# Patient Record
Sex: Female | Born: 2005 | Race: White | Hispanic: No | Smoking: Never smoker
Health system: Southern US, Community
[De-identification: ages and names within clinical notes are randomized; demographics above are authoritative.]

## PROBLEM LIST (undated history)

## (undated) DIAGNOSIS — R011 Cardiac murmur, unspecified: Secondary | ICD-10-CM

---
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
## 2023-05-26 ENCOUNTER — Ambulatory Visit (INDEPENDENT_AMBULATORY_CARE_PROVIDER_SITE_OTHER): Payer: BC Managed Care – PPO

## 2023-05-26 VITALS — BP 100/64 | HR 70 | Ht 61.0 in | Wt 129.0 lb

## 2023-05-26 DIAGNOSIS — Z3201 Encounter for pregnancy test, result positive: Secondary | ICD-10-CM

## 2023-05-26 LAB — POCT URINE PREGNANCY: Preg Test, Ur: POSITIVE — AB

## 2023-05-26 NOTE — Progress Notes (Addendum)
Ms. Gilleran presents today for UPT. She has no unusual complaints.  LMP: 04/13/2023    OBJECTIVE: Appears well, in no apparent distress.  OB History     Gravida  1   Para      Term      Preterm      AB      Living         SAB      IAB      Ectopic      Multiple      Live Births             Home UPT Result:POSITIVE In-Office UPT result: POSITIVE  I have reviewed the patient's medical, obstetrical, social, and family histories, and medications.   ASSESSMENT: Positive pregnancy test LMP  04/13/2023 EDD  01/18/2024 GA     [redacted]w[redacted]d  PLAN Prenatal care to be completed at: [facility].  Make appointments for Nurse Intake and NOB visit.

## 2023-07-08 DIAGNOSIS — Z3A12 12 weeks gestation of pregnancy: Secondary | ICD-10-CM | POA: Diagnosis not present

## 2023-07-08 DIAGNOSIS — O3680X9 Pregnancy with inconclusive fetal viability, other fetus: Secondary | ICD-10-CM | POA: Diagnosis not present

## 2023-07-08 DIAGNOSIS — N912 Amenorrhea, unspecified: Secondary | ICD-10-CM | POA: Diagnosis not present

## 2023-07-09 DIAGNOSIS — N925 Other specified irregular menstruation: Secondary | ICD-10-CM | POA: Diagnosis not present

## 2023-07-09 DIAGNOSIS — R112 Nausea with vomiting, unspecified: Secondary | ICD-10-CM | POA: Diagnosis not present

## 2023-07-14 ENCOUNTER — Encounter: Payer: BC Managed Care – PPO | Admitting: Advanced Practice Midwife

## 2023-08-04 DIAGNOSIS — Z113 Encounter for screening for infections with a predominantly sexual mode of transmission: Secondary | ICD-10-CM | POA: Diagnosis not present

## 2023-08-04 DIAGNOSIS — Z362 Encounter for other antenatal screening follow-up: Secondary | ICD-10-CM | POA: Diagnosis not present

## 2023-08-06 LAB — OB RESULTS CONSOLE GC/CHLAMYDIA
Chlamydia: NEGATIVE
Neisseria Gonorrhea: NEGATIVE

## 2023-09-02 DIAGNOSIS — Z364 Encounter for antenatal screening for fetal growth retardation: Secondary | ICD-10-CM | POA: Diagnosis not present

## 2023-09-02 DIAGNOSIS — Z363 Encounter for antenatal screening for malformations: Secondary | ICD-10-CM | POA: Diagnosis not present

## 2023-09-02 DIAGNOSIS — Z3A20 20 weeks gestation of pregnancy: Secondary | ICD-10-CM | POA: Diagnosis not present

## 2024-01-08 ENCOUNTER — Other Ambulatory Visit: Payer: Self-pay | Admitting: Obstetrics & Gynecology

## 2024-01-16 ENCOUNTER — Encounter (HOSPITAL_COMMUNITY): Payer: Self-pay | Admitting: Obstetrics and Gynecology

## 2024-01-16 ENCOUNTER — Inpatient Hospital Stay (HOSPITAL_COMMUNITY)
Admission: AD | Admit: 2024-01-16 | Discharge: 2024-01-19 | DRG: 787 | Disposition: A | Discharge status: Home / Self Care | Payer: Medicaid Other | Attending: Obstetrics and Gynecology | Admitting: Obstetrics and Gynecology

## 2024-01-16 ENCOUNTER — Inpatient Hospital Stay (HOSPITAL_COMMUNITY): Payer: Medicaid Other | Admitting: Anesthesiology

## 2024-01-16 ENCOUNTER — Other Ambulatory Visit: Payer: Self-pay

## 2024-01-16 DIAGNOSIS — D62 Acute posthemorrhagic anemia: Secondary | ICD-10-CM | POA: Diagnosis not present

## 2024-01-16 DIAGNOSIS — O9081 Anemia of the puerperium: Secondary | ICD-10-CM | POA: Diagnosis not present

## 2024-01-16 DIAGNOSIS — Z8249 Family history of ischemic heart disease and other diseases of the circulatory system: Secondary | ICD-10-CM | POA: Diagnosis not present

## 2024-01-16 DIAGNOSIS — O26893 Other specified pregnancy related conditions, third trimester: Secondary | ICD-10-CM | POA: Diagnosis not present

## 2024-01-16 DIAGNOSIS — Z9049 Acquired absence of other specified parts of digestive tract: Secondary | ICD-10-CM

## 2024-01-16 DIAGNOSIS — Z3A39 39 weeks gestation of pregnancy: Secondary | ICD-10-CM

## 2024-01-16 DIAGNOSIS — Z98891 History of uterine scar from previous surgery: Secondary | ICD-10-CM

## 2024-01-16 DIAGNOSIS — O3663X0 Maternal care for excessive fetal growth, third trimester, not applicable or unspecified: Secondary | ICD-10-CM | POA: Diagnosis not present

## 2024-01-16 LAB — OB RESULTS CONSOLE HIV ANTIBODY (ROUTINE TESTING): HIV: NONREACTIVE

## 2024-01-16 LAB — CBC
HCT: 33.9 % — ABNORMAL LOW (ref 36.0–46.0)
Hemoglobin: 11.3 g/dL — ABNORMAL LOW (ref 12.0–15.0)
MCH: 27.4 pg (ref 26.0–34.0)
MCHC: 33.3 g/dL (ref 30.0–36.0)
MCV: 82.3 fL (ref 80.0–100.0)
Platelets: 206 10*3/uL (ref 150–400)
RBC: 4.12 MIL/uL (ref 3.87–5.11)
RDW: 13.4 % (ref 11.5–15.5)
WBC: 13.3 10*3/uL — ABNORMAL HIGH (ref 4.0–10.5)
nRBC: 0 % (ref 0.0–0.2)

## 2024-01-16 LAB — OB RESULTS CONSOLE GBS: GBS: NEGATIVE

## 2024-01-16 LAB — TYPE AND SCREEN
ABO/RH(D): O POS
Antibody Screen: NEGATIVE

## 2024-01-16 LAB — OB RESULTS CONSOLE RUBELLA ANTIBODY, IGM: Rubella: IMMUNE

## 2024-01-16 LAB — OB RESULTS CONSOLE HEPATITIS B SURFACE ANTIGEN: Hepatitis B Surface Ag: NEGATIVE

## 2024-01-16 LAB — OB RESULTS CONSOLE VARICELLA ZOSTER ANTIBODY, IGG: Varicella: IMMUNE

## 2024-01-16 LAB — RPR: RPR Ser Ql: NONREACTIVE

## 2024-01-16 LAB — HEPATITIS C ANTIBODY: HCV Ab: NEGATIVE

## 2024-01-16 MED ORDER — LACTATED RINGERS IV SOLN: INTRAVENOUS | Status: DC

## 2024-01-16 MED ORDER — LACTATED RINGERS IV SOLN
500.0000 mL | INTRAVENOUS | Status: DC | PRN
Start: 2024-01-16 — End: 2024-01-17
  Administered 2024-01-16 (×2): 500 mL via INTRAVENOUS
  Administered 2024-01-16: 1000 mL via INTRAVENOUS

## 2024-01-16 MED ORDER — FENTANYL CITRATE (PF) 100 MCG/2ML IJ SOLN: 50.0000 ug | INTRAMUSCULAR | Status: DC | PRN

## 2024-01-16 MED ORDER — FENTANYL-BUPIVACAINE-NACL 0.5-0.125-0.9 MG/250ML-% EP SOLN
12.0000 mL/h | EPIDURAL | Status: DC | PRN
  Filled 2024-01-16: qty 250

## 2024-01-16 MED ORDER — LACTATED RINGERS IV SOLN: 500.0000 mL | Freq: Once | INTRAVENOUS | Status: DC

## 2024-01-16 MED ORDER — EPHEDRINE 5 MG/ML INJ: 10.0000 mg | INTRAVENOUS | Status: DC | PRN

## 2024-01-16 MED ORDER — ONDANSETRON HCL 4 MG/2ML IJ SOLN
4.0000 mg | Freq: Four times a day (QID) | INTRAMUSCULAR | Status: DC | PRN
  Administered 2024-01-16: 4 mg via INTRAVENOUS
  Filled 2024-01-16: qty 2

## 2024-01-16 MED ORDER — FENTANYL-BUPIVACAINE-NACL 0.5-0.125-0.9 MG/250ML-% EP SOLN
EPIDURAL | Status: DC | PRN
  Administered 2024-01-16: 12 mL/h via EPIDURAL

## 2024-01-16 MED ORDER — SOD CITRATE-CITRIC ACID 500-334 MG/5ML PO SOLN
30.0000 mL | ORAL | Status: DC | PRN
  Administered 2024-01-17: 30 mL via ORAL
  Filled 2024-01-16: qty 30

## 2024-01-16 MED ORDER — PHENYLEPHRINE 80 MCG/ML (10ML) SYRINGE FOR IV PUSH (FOR BLOOD PRESSURE SUPPORT)
80.0000 ug | PREFILLED_SYRINGE | INTRAVENOUS | Status: DC | PRN
  Administered 2024-01-16: 160 ug via INTRAVENOUS
  Filled 2024-01-16: qty 10

## 2024-01-16 MED ORDER — OXYCODONE-ACETAMINOPHEN 5-325 MG PO TABS: 2.0000 | ORAL_TABLET | ORAL | Status: DC | PRN

## 2024-01-16 MED ORDER — LACTATED RINGERS IV SOLN
500.0000 mL | Freq: Once | INTRAVENOUS | Status: AC
  Administered 2024-01-16: 500 mL via INTRAVENOUS

## 2024-01-16 MED ORDER — OXYTOCIN BOLUS FROM INFUSION: 333.0000 mL | Freq: Once | INTRAVENOUS | Status: DC

## 2024-01-16 MED ORDER — PHENYLEPHRINE 80 MCG/ML (10ML) SYRINGE FOR IV PUSH (FOR BLOOD PRESSURE SUPPORT): 80.0000 ug | PREFILLED_SYRINGE | INTRAVENOUS | Status: DC | PRN

## 2024-01-16 MED ORDER — EPHEDRINE 5 MG/ML INJ
10.0000 mg | INTRAVENOUS | Status: DC | PRN
Start: 2024-01-16 — End: 2024-01-17

## 2024-01-16 MED ORDER — LIDOCAINE HCL (PF) 1 % IJ SOLN
INTRAMUSCULAR | Status: DC | PRN
  Administered 2024-01-16: 5 mL via EPIDURAL

## 2024-01-16 MED ORDER — EPHEDRINE 5 MG/ML INJ
10.0000 mg | INTRAVENOUS | Status: DC | PRN
  Filled 2024-01-16: qty 5

## 2024-01-16 MED ORDER — OXYCODONE-ACETAMINOPHEN 5-325 MG PO TABS: 1.0000 | ORAL_TABLET | ORAL | Status: DC | PRN

## 2024-01-16 MED ORDER — PHENYLEPHRINE 80 MCG/ML (10ML) SYRINGE FOR IV PUSH (FOR BLOOD PRESSURE SUPPORT)
80.0000 ug | PREFILLED_SYRINGE | INTRAVENOUS | Status: AC | PRN
  Administered 2024-01-16: 160 ug via INTRAVENOUS
  Administered 2024-01-16: 80 ug via INTRAVENOUS

## 2024-01-16 MED ORDER — LIDOCAINE HCL (PF) 1 % IJ SOLN: 30.0000 mL | INTRAMUSCULAR | Status: DC | PRN

## 2024-01-16 MED ORDER — DIPHENHYDRAMINE HCL 50 MG/ML IJ SOLN: 12.5000 mg | INTRAMUSCULAR | Status: DC | PRN

## 2024-01-16 MED ORDER — OXYTOCIN-SODIUM CHLORIDE 30-0.9 UT/500ML-% IV SOLN
2.5000 [IU]/h | INTRAVENOUS | Status: DC
  Filled 2024-01-16: qty 500

## 2024-01-16 MED ORDER — FLEET ENEMA RE ENEM: 1.0000 | ENEMA | RECTAL | Status: DC | PRN

## 2024-01-16 MED ORDER — ACETAMINOPHEN 325 MG PO TABS: 650.0000 mg | ORAL_TABLET | ORAL | Status: DC | PRN

## 2024-01-16 MED ORDER — TRANEXAMIC ACID-NACL 1000-0.7 MG/100ML-% IV SOLN
INTRAVENOUS | Status: AC
  Filled 2024-01-16: qty 100

## 2024-01-16 NOTE — Progress Notes (Signed)
   Labor Progress Note  Heather Hall, 18 y.o., G1P0, with an IUP @ [redacted]w[redacted]d, presented for female presenting for contractions. GBS-. LR Female.   Subjective: Assumed care for pt in NAD. Pt admitted in latent labor and progress to Complete @ 0802 pm. Pushed for 2 hours at station 1+ with pressure felt without change, pt desires to labor down. Suspected LGA versus CPD. Discussed with pt different scenarios included shoulder dystocia and risk, informed pt she could request a PCS at any point if she desires, pt and partner hoping for a vaginal delivery still. Late decels noted with pushing but resolved with position changes. Pt desires a rest now and feeling very tired, but has good pushing effort, Pt left with Cat 1 after after having cxt 2 strip. Patient Active Problem List   Diagnosis Date Noted   Indication for care in labor or delivery 01/16/2024   Innocent heart murmur 10/12/2018   Hx of cholecystectomy 10/12/2018   Biliary dyskinesia 11/11/2017   Patellar tendinitis 02/18/2017   Pain of finger of right hand 03/17/2013   Objective: BP 118/73   Pulse 86   Temp 98.1 F (36.7 C) (Oral)   Resp 18   Wt 74.6 kg   LMP 04/13/2023 (Exact Date)   SpO2 100%   BMI 31.09 kg/m  I/O last 3 completed shifts: In: -  Out: 800 [Urine:800] No intake/output data recorded. NST: FHR baseline 140 bpm, Variability: moderate, Accelerations:present, Decelerations:  Present  late decles noted  = Cat 2/Reactive with scalp stimulations CTX:  regular, every 3 minutes Uterus gravid, soft non tender, moderate to palpate with contractions.  SVE:  Dilation: 10 Effacement (%): 100 Station: Plus 1 Exam by:: Mery Guadalupe CNM Pitocin at 26mUn/min  Assessment:  Heather Hall, 18 y.o., G1P0, with an IUP @ [redacted]w[redacted]d, presented for female presenting for contractions. GBS-. LR Female. Complete @ 2003, s/p pushing for 2 hours, on labor down break with good effort, no descent, suspect CPD.   Patient Active Problem List   Diagnosis Date  Noted   Indication for care in labor or delivery 01/16/2024   Innocent heart murmur 10/12/2018   Hx of cholecystectomy 10/12/2018   Biliary dyskinesia 11/11/2017   Patellar tendinitis 02/18/2017   Pain of finger of right hand 03/17/2013   NICHD: Category 2/1 Category 2 with active intrauterine resuscitative measures maternal position change, fluid bolus.  Membranes:  AROM mod mec @ 1343 on 1/18, no s/s of infection  Induction:    Cytotec xN/A  Foley Bulb: N/A  Pitocin - N?A  Pain management:               IV pain management: x  Nitrous:              Epidural placement:  at 0837 on 1/18  GBS Negative    Plan: Continue labor plan Continuous monitoring Rest Frequent position changes to facilitate fetal rotation and descent. Will reassess with cervical exam at 2 hours or earlier if necessary Plan to Labor down for 1-2 hours per pt to rest.  Anticipate labor progression and vaginal delivery.   Md Su Hilt aware of plan and verbalized agreement.   [facility] CNM, FNP-C, PMHNP-BC  [address]  [place], [place] [address]  Cell: [phone]  Office Phone: [phone] Fax: [phone] 01/16/2024  10:16 PM

## 2024-01-16 NOTE — Anesthesia Preprocedure Evaluation (Signed)
Anesthesia Evaluation  Patient identified by MRN, date of birth, ID band Patient awake    Reviewed: Allergy & Precautions, NPO status   Airway Mallampati: II  TM Distance: >3 FB Neck ROM: Full    Dental no notable dental hx. (+) Teeth Intact, Dental Advisory Given   Pulmonary neg pulmonary ROS   Pulmonary exam normal breath sounds clear to auscultation       Cardiovascular Normal cardiovascular exam Rhythm:Regular Rate:Normal     Neuro/Psych negative neurological ROS  negative psych ROS   GI/Hepatic   Endo/Other    Renal/GU      Musculoskeletal   Abdominal   Peds  Hematology Lab Results      Component                Value               Date                      WBC                      13.3 (H)            01/16/2024                HGB                      11.3 (L)            01/16/2024                HCT                      33.9 (L)            01/16/2024                MCV                      82.3                01/16/2024                PLT                      206                 01/16/2024              Anesthesia Other Findings   Reproductive/Obstetrics (+) Pregnancy                              Anesthesia Physical Anesthesia Plan  ASA: 2  Anesthesia Plan: Epidural   Post-op Pain Management:    Induction:   PONV Risk Score and Plan:   Airway Management Planned:   Additional Equipment:   Intra-op Plan:   Post-operative Plan:   Informed Consent: I have reviewed the patients History and Physical, chart, labs and discussed the procedure including the risks, benefits and alternatives for the proposed anesthesia with the patient or authorized representative who has indicated his/her understanding and acceptance.       Plan Discussed with:   Anesthesia Plan Comments: (39.5 wk Primagraviada for LEA)         Anesthesia Quick Evaluation

## 2024-01-16 NOTE — MAU Note (Signed)
.  Heather Hall is a 18 y.o. at [redacted]w[redacted]d here in MAU reporting: Pt woke up at 0430 and felt liquid running down her legs. Pt reports it was clearish/yellowish Pt reports it did not smell like pee so she is unsure if it was her water or if it was pee Pt reports ctx's every 5 minutes  Pain score: 9/10 Reports +fm There were no vitals filed for this visit.    Lab orders placed from triage:   none

## 2024-01-16 NOTE — MAU Note (Signed)

## 2024-01-16 NOTE — Progress Notes (Signed)
Heather Hall is a 18 y.o. G1P0 at [redacted]w[redacted]d   Subjective: Called by RN at [phone] with report of AL/+1 exam.  Upon my arrival, pt was Rim-AL/0-+1.  Pt tried pushing a few times to see if cervix could be reduced but while on back, pt started having decels, some appearing late.  Pt given a bolus and changed position to the left with recovery to cat 1 tracing.  Spontaneous decel occurred to placed pt on her right and good recovery to cat 1 tracing.  Objective: BP 116/76   Pulse 98   Temp 98.1 F (36.7 C) (Oral)   Resp 18   Wt 74.6 kg   LMP 04/13/2023 (Exact Date)   SpO2 99%   BMI 31.09 kg/m  I/O last 3 completed shifts: In: -  Out: 800 [Urine:800] No intake/output data recorded.  FHT:  FHR: 125 bpm, variability: moderate,  accelerations:  Present,  decelerations:  Absent UC:   q3-80min SVE:   Dilation: Lip/rim Effacement (%): 100 Station: Plus 1 Exam by:: R Kappert RN  Labs: Lab Results  Component Value Date   WBC 13.3 (H) 01/16/2024   HGB 11.3 (L) 01/16/2024   HCT 33.9 (L) 01/16/2024   MCV 82.3 01/16/2024   PLT 206 01/16/2024    Assessment / Plan: Spontaneous labor, progressing normally  Labor:  Will continue expectant mgmt  Preeclampsia:  no signs or symptoms of toxicity Fetal Wellbeing:  Category I Pain Control:  Epidural I/D:   GBS neg Anticipated MOD:   Unsure d/t episodes of decels and potential LGA.  EFW on ultrasound 12/24/23 7.8lbs 96%.  Purcell Nails, MD 01/16/2024, 8:23 PM

## 2024-01-16 NOTE — Anesthesia Procedure Notes (Signed)
Epidural Patient location during procedure: OB Start time: 01/16/2024 8:37 AM End time: 01/16/2024 8:48 AM  Staffing Anesthesiologist: Trevor Iha, MD Performed: anesthesiologist   Preanesthetic Checklist Completed: patient identified, IV checked, site marked, risks and benefits discussed, surgical consent, monitors and equipment checked, pre-op evaluation and timeout performed  Epidural Patient position: sitting Prep: DuraPrep and site prepped and draped Patient monitoring: continuous pulse ox and blood pressure Approach: midline Location: L3-L4 Injection technique: LOR air  Needle:  Needle type: Tuohy  Needle gauge: 17 G Needle length: 9 cm and 9 Needle insertion depth: 6 cm Catheter type: closed end flexible Catheter size: 19 Gauge Catheter at skin depth: 11 cm Test dose: negative  Assessment Events: blood not aspirated, no cerebrospinal fluid, injection not painful, no injection resistance, no paresthesia and negative IV test  Additional Notes Patient identified. Risks/Benefits/Options discussed with patient including but not limited to bleeding, infection, nerve damage, paralysis, failed block, incomplete pain control, headache, blood pressure changes, nausea, vomiting, reactions to medication both or allergic, itching and postpartum back pain. Confirmed with bedside nurse the patient's most recent platelet count. Confirmed with patient that they are not currently taking any anticoagulation, have any bleeding history or any family history of bleeding disorders. Patient expressed understanding and wished to proceed. All questions were answered. Sterile technique was used throughout the entire procedure. Please see nursing notes for vital signs. Test dose was given through epidural needle and negative prior to continuing to dose epidural or start infusion. Warning signs of high block given to the patient including shortness of breath, tingling/numbness in hands, complete  motor block, or any concerning symptoms with instructions to call for help. Patient was given instructions on fall risk and not to get out of bed. All questions and concerns addressed with instructions to call with any issues. 1  Attempt (S) . Patient tolerated procedure well.

## 2024-01-16 NOTE — H&P (Signed)
Heather Hall is a 18 y.o. female presenting for leaking fluid and contractions.  OB History     Gravida  1   Para      Term      Preterm      AB      Living         SAB      IAB      Ectopic      Multiple      Live Births             Past Medical History:  Diagnosis Date   Heart murmur    Murmur    Murmur    Past Surgical History:  Procedure Laterality Date   CHOLECYSTECTOMY  11/11/2017   LAPAROSCOPIC CHOLECYSTECTOMY PEDIATRIC N/A 11/11/2017   Procedure: LAPAROSCOPIC CHOLECYSTECTOMY PEDIATRIC;  Surgeon: Kandice Hams, MD;  Location: [facility] OR;  Service: Pediatrics;  Laterality: N/A;   Family History: family history includes ADD / ADHD in an other family member; Gallstones in her mother; Hypertension in her paternal uncle; Hypothyroidism in her mother; Learning disabilities in her paternal uncle. Social History:  reports that she has never smoked. She uses smokeless tobacco. She reports that she does not drink alcohol and does not use drugs.     Maternal Diabetes: No Genetic Screening: Normal Maternal Ultrasounds/Referrals: Normal Fetal Ultrasounds or other Referrals:  None Maternal Substance Abuse:  No Significant Maternal Medications:  Meds include: Other: doxylamine and pyridoxine Significant Maternal Lab Results:  Group B Strep negative Number of Prenatal Visits:greater than 3 verified prenatal visits Maternal Vaccinations: None documented Other Comments:  None  Review of Systems Denies F/C/N/V/D  History Dilation: 6 Effacement (%): 90 Station: 0 Exam by:: Lance Morin, MD  AROM forebag with meconium stained fluid  Blood pressure (!) 105/55, pulse 93, temperature 98.1 F (36.7 C), temperature source Oral, resp. rate 18, weight 74.6 kg, last menstrual period 04/13/2023, SpO2 98%. Exam Physical Exam  Lungs unlabored breathing CV RRR Abdomen gravid, NT Extremities no calf tenderness  FHT 145, + accels, no decels, mod variability Toco not  picking up well  Prenatal labs: ABO, Rh: --/--/O POS (01/18 0804) Antibody: NEG (01/18 0804) Rubella:  Immune RPR: NON REACTIVE (01/18 0804)  HBsAg:   Negative HIV:   Non Reactive GBS:   Negative  Assessment/Plan: 18yo G1P0000 at 39 5/7wks presenting in labor.  Fetal status is reassuring with cat 1 tracing.  Pt is comfortable with an epidural.  Anticipate NSVD.  Purcell Nails 01/16/2024, 1:31 PM

## 2024-01-17 ENCOUNTER — Encounter (HOSPITAL_COMMUNITY)
Admission: AD | Disposition: A | Discharge status: Home / Self Care | Payer: Self-pay | Source: Home / Self Care | Attending: Obstetrics and Gynecology

## 2024-01-17 ENCOUNTER — Encounter (HOSPITAL_COMMUNITY): Payer: Self-pay | Admitting: Obstetrics and Gynecology

## 2024-01-17 DIAGNOSIS — O368390 Maternal care for abnormalities of the fetal heart rate or rhythm, unspecified trimester, not applicable or unspecified: Secondary | ICD-10-CM | POA: Diagnosis not present

## 2024-01-17 DIAGNOSIS — Z3A39 39 weeks gestation of pregnancy: Secondary | ICD-10-CM | POA: Diagnosis not present

## 2024-01-17 DIAGNOSIS — O324XX0 Maternal care for high head at term, not applicable or unspecified: Secondary | ICD-10-CM | POA: Diagnosis not present

## 2024-01-17 DIAGNOSIS — D62 Acute posthemorrhagic anemia: Secondary | ICD-10-CM | POA: Diagnosis not present

## 2024-01-17 DIAGNOSIS — Z98891 History of uterine scar from previous surgery: Secondary | ICD-10-CM

## 2024-01-17 LAB — CBC
HCT: 25.6 % — ABNORMAL LOW (ref 36.0–46.0)
Hemoglobin: 8.6 g/dL — ABNORMAL LOW (ref 12.0–15.0)
MCH: 27.5 pg (ref 26.0–34.0)
MCHC: 33.6 g/dL (ref 30.0–36.0)
MCV: 81.8 fL (ref 80.0–100.0)
Platelets: 171 10*3/uL (ref 150–400)
RBC: 3.13 MIL/uL — ABNORMAL LOW (ref 3.87–5.11)
RDW: 13.7 % (ref 11.5–15.5)
WBC: 21.5 10*3/uL — ABNORMAL HIGH (ref 4.0–10.5)
nRBC: 0 % (ref 0.0–0.2)

## 2024-01-17 SURGERY — Surgical Case
Anesthesia: Epidural | Site: Abdomen

## 2024-01-17 MED ORDER — FENTANYL CITRATE (PF) 100 MCG/2ML IJ SOLN
INTRAMUSCULAR | Status: DC | PRN
  Administered 2024-01-17: 100 ug via EPIDURAL

## 2024-01-17 MED ORDER — DEXAMETHASONE SODIUM PHOSPHATE 4 MG/ML IJ SOLN
INTRAMUSCULAR | Status: DC | PRN
  Administered 2024-01-17: 8 mg via INTRAVENOUS

## 2024-01-17 MED ORDER — SENNOSIDES-DOCUSATE SODIUM 8.6-50 MG PO TABS
2.0000 | ORAL_TABLET | Freq: Every day | ORAL | Status: DC
  Administered 2024-01-18 – 2024-01-19 (×2): 2 via ORAL
  Filled 2024-01-17 (×2): qty 2

## 2024-01-17 MED ORDER — OXYTOCIN-SODIUM CHLORIDE 30-0.9 UT/500ML-% IV SOLN
2.5000 [IU]/h | INTRAVENOUS | Status: AC
  Administered 2024-01-17: 2.5 [IU]/h via INTRAVENOUS
  Filled 2024-01-17: qty 500

## 2024-01-17 MED ORDER — COCONUT OIL OIL
1.0000 | TOPICAL_OIL | Status: DC | PRN
  Administered 2024-01-19: 1 via TOPICAL

## 2024-01-17 MED ORDER — TRANEXAMIC ACID-NACL 1000-0.7 MG/100ML-% IV SOLN
INTRAVENOUS | Status: AC
  Filled 2024-01-17: qty 100

## 2024-01-17 MED ORDER — TRANEXAMIC ACID-NACL 1000-0.7 MG/100ML-% IV SOLN
INTRAVENOUS | Status: DC | PRN
  Administered 2024-01-17: 1000 mg via INTRAVENOUS

## 2024-01-17 MED ORDER — PRENATAL MULTIVITAMIN CH
1.0000 | ORAL_TABLET | Freq: Every day | ORAL | Status: DC
  Administered 2024-01-17 – 2024-01-19 (×3): 1 via ORAL
  Filled 2024-01-17 (×3): qty 1

## 2024-01-17 MED ORDER — DIBUCAINE (PERIANAL) 1 % EX OINT: 1.0000 | TOPICAL_OINTMENT | CUTANEOUS | Status: DC | PRN

## 2024-01-17 MED ORDER — SIMETHICONE 80 MG PO CHEW
80.0000 mg | CHEWABLE_TABLET | Freq: Three times a day (TID) | ORAL | Status: DC
  Administered 2024-01-17 – 2024-01-19 (×7): 80 mg via ORAL
  Filled 2024-01-17 (×7): qty 1

## 2024-01-17 MED ORDER — ACETAMINOPHEN 500 MG PO TABS
ORAL_TABLET | ORAL | Status: AC
  Filled 2024-01-17: qty 2

## 2024-01-17 MED ORDER — SODIUM CHLORIDE 0.9 % IV SOLN: INTRAVENOUS | Status: DC | PRN

## 2024-01-17 MED ORDER — ONDANSETRON HCL 4 MG/2ML IJ SOLN
INTRAMUSCULAR | Status: AC
  Filled 2024-01-17: qty 2

## 2024-01-17 MED ORDER — HYDROMORPHONE HCL 1 MG/ML IJ SOLN: 0.2500 mg | INTRAMUSCULAR | Status: DC | PRN

## 2024-01-17 MED ORDER — DEXMEDETOMIDINE HCL IN NACL 200 MCG/50ML IV SOLN
INTRAVENOUS | Status: DC | PRN
  Administered 2024-01-17: 8 ug via INTRAVENOUS

## 2024-01-17 MED ORDER — DEXAMETHASONE SODIUM PHOSPHATE 4 MG/ML IJ SOLN
INTRAMUSCULAR | Status: AC
  Filled 2024-01-17: qty 2

## 2024-01-17 MED ORDER — SODIUM CHLORIDE 0.9 % IR SOLN
Status: DC | PRN
  Administered 2024-01-17: 1000 mL

## 2024-01-17 MED ORDER — ONDANSETRON HCL 4 MG/2ML IJ SOLN: 4.0000 mg | Freq: Three times a day (TID) | INTRAMUSCULAR | Status: DC | PRN

## 2024-01-17 MED ORDER — METOCLOPRAMIDE HCL 5 MG/ML IJ SOLN
INTRAMUSCULAR | Status: AC
  Filled 2024-01-17: qty 2

## 2024-01-17 MED ORDER — LIDOCAINE-EPINEPHRINE (PF) 2 %-1:200000 IJ SOLN
INTRAMUSCULAR | Status: DC | PRN
  Administered 2024-01-17: 3 mL via EPIDURAL
  Administered 2024-01-17 (×2): 5 mL via EPIDURAL

## 2024-01-17 MED ORDER — KETOROLAC TROMETHAMINE 30 MG/ML IJ SOLN
30.0000 mg | Freq: Four times a day (QID) | INTRAMUSCULAR | Status: AC
  Administered 2024-01-17 – 2024-01-18 (×4): 30 mg via INTRAVENOUS
  Filled 2024-01-17 (×4): qty 1

## 2024-01-17 MED ORDER — NALOXONE HCL 4 MG/10ML IJ SOLN: 1.0000 ug/kg/h | INTRAVENOUS | Status: DC | PRN

## 2024-01-17 MED ORDER — OXYCODONE HCL 5 MG PO TABS: 5.0000 mg | ORAL_TABLET | Freq: Once | ORAL | Status: DC | PRN

## 2024-01-17 MED ORDER — KETOROLAC TROMETHAMINE 30 MG/ML IJ SOLN
30.0000 mg | Freq: Once | INTRAMUSCULAR | Status: AC | PRN
  Administered 2024-01-17: 30 mg via INTRAVENOUS

## 2024-01-17 MED ORDER — DIPHENHYDRAMINE HCL 50 MG/ML IJ SOLN: 12.5000 mg | INTRAMUSCULAR | Status: DC | PRN

## 2024-01-17 MED ORDER — ONDANSETRON HCL 4 MG/2ML IJ SOLN
INTRAMUSCULAR | Status: DC | PRN
  Administered 2024-01-17: 4 mg via INTRAVENOUS

## 2024-01-17 MED ORDER — ACETAMINOPHEN 500 MG PO TABS
1000.0000 mg | ORAL_TABLET | Freq: Four times a day (QID) | ORAL | Status: DC
  Administered 2024-01-17 – 2024-01-19 (×10): 1000 mg via ORAL
  Filled 2024-01-17 (×11): qty 2

## 2024-01-17 MED ORDER — SOD CITRATE-CITRIC ACID 500-334 MG/5ML PO SOLN: 30.0000 mL | ORAL | Status: DC

## 2024-01-17 MED ORDER — LIDOCAINE-EPINEPHRINE (PF) 2 %-1:200000 IJ SOLN
INTRAMUSCULAR | Status: AC
Start: 2024-01-17 — End: ?
  Filled 2024-01-17: qty 20

## 2024-01-17 MED ORDER — ZOLPIDEM TARTRATE 5 MG PO TABS: 5.0000 mg | ORAL_TABLET | Freq: Every evening | ORAL | Status: DC | PRN

## 2024-01-17 MED ORDER — KETOROLAC TROMETHAMINE 30 MG/ML IJ SOLN
INTRAMUSCULAR | Status: AC
  Filled 2024-01-17: qty 1

## 2024-01-17 MED ORDER — MEPERIDINE HCL 25 MG/ML IJ SOLN
6.2500 mg | INTRAMUSCULAR | Status: DC | PRN
Start: 2024-01-17 — End: 2024-01-17

## 2024-01-17 MED ORDER — IBUPROFEN 600 MG PO TABS
600.0000 mg | ORAL_TABLET | Freq: Four times a day (QID) | ORAL | Status: DC
  Administered 2024-01-18 – 2024-01-19 (×6): 600 mg via ORAL
  Filled 2024-01-17 (×6): qty 1

## 2024-01-17 MED ORDER — OXYTOCIN-SODIUM CHLORIDE 30-0.9 UT/500ML-% IV SOLN
INTRAVENOUS | Status: AC
  Filled 2024-01-17: qty 500

## 2024-01-17 MED ORDER — OXYTOCIN-SODIUM CHLORIDE 30-0.9 UT/500ML-% IV SOLN
INTRAVENOUS | Status: DC | PRN
  Administered 2024-01-17: 30 [IU] via INTRAVENOUS

## 2024-01-17 MED ORDER — WITCH HAZEL-GLYCERIN EX PADS: 1.0000 | MEDICATED_PAD | CUTANEOUS | Status: DC | PRN

## 2024-01-17 MED ORDER — NALOXONE HCL 0.4 MG/ML IJ SOLN: 0.4000 mg | INTRAMUSCULAR | Status: DC | PRN

## 2024-01-17 MED ORDER — SCOPOLAMINE 1 MG/3DAYS TD PT72
MEDICATED_PATCH | TRANSDERMAL | Status: AC
  Filled 2024-01-17: qty 1

## 2024-01-17 MED ORDER — FENTANYL CITRATE (PF) 100 MCG/2ML IJ SOLN
INTRAMUSCULAR | Status: AC
  Filled 2024-01-17: qty 2

## 2024-01-17 MED ORDER — SODIUM CHLORIDE 0.9% FLUSH: 3.0000 mL | INTRAVENOUS | Status: DC | PRN

## 2024-01-17 MED ORDER — DIPHENHYDRAMINE HCL 25 MG PO CAPS
25.0000 mg | ORAL_CAPSULE | Freq: Four times a day (QID) | ORAL | Status: DC | PRN
Start: 2024-01-17 — End: 2024-01-19

## 2024-01-17 MED ORDER — ONDANSETRON HCL 4 MG/2ML IJ SOLN: 4.0000 mg | Freq: Once | INTRAMUSCULAR | Status: DC | PRN

## 2024-01-17 MED ORDER — OXYCODONE HCL 5 MG PO TABS
5.0000 mg | ORAL_TABLET | ORAL | Status: DC | PRN
  Administered 2024-01-18 – 2024-01-19 (×3): 10 mg via ORAL
  Filled 2024-01-17 (×3): qty 2

## 2024-01-17 MED ORDER — STERILE WATER FOR IRRIGATION IR SOLN
Status: DC | PRN
  Administered 2024-01-17: 1000 mL

## 2024-01-17 MED ORDER — OXYCODONE HCL 5 MG/5ML PO SOLN: 5.0000 mg | Freq: Once | ORAL | Status: DC | PRN

## 2024-01-17 MED ORDER — MORPHINE SULFATE (PF) 0.5 MG/ML IJ SOLN
INTRAMUSCULAR | Status: AC
  Filled 2024-01-17: qty 10

## 2024-01-17 MED ORDER — DIPHENHYDRAMINE HCL 25 MG PO CAPS: 25.0000 mg | ORAL_CAPSULE | ORAL | Status: DC | PRN

## 2024-01-17 MED ORDER — CEFAZOLIN SODIUM-DEXTROSE 2-4 GM/100ML-% IV SOLN
2.0000 g | INTRAVENOUS | Status: AC
  Administered 2024-01-17 (×2): 2 g via INTRAVENOUS

## 2024-01-17 MED ORDER — SIMETHICONE 80 MG PO CHEW: 80.0000 mg | CHEWABLE_TABLET | ORAL | Status: DC | PRN

## 2024-01-17 MED ORDER — MORPHINE SULFATE (PF) 0.5 MG/ML IJ SOLN
INTRAMUSCULAR | Status: DC | PRN
  Administered 2024-01-17: 3 mg via EPIDURAL

## 2024-01-17 MED ORDER — SODIUM CHLORIDE 0.9 % IV SOLN
500.0000 mg | INTRAVENOUS | Status: AC
  Administered 2024-01-17: 500 mg via INTRAVENOUS

## 2024-01-17 MED ORDER — MENTHOL 3 MG MT LOZG: 1.0000 | LOZENGE | OROMUCOSAL | Status: DC | PRN

## 2024-01-17 MED ORDER — SCOPOLAMINE 1 MG/3DAYS TD PT72
1.0000 | MEDICATED_PATCH | Freq: Once | TRANSDERMAL | Status: DC
  Administered 2024-01-17: 1.5 mg via TRANSDERMAL

## 2024-01-17 SURGICAL SUPPLY — 33 items
BARRIER ADHS 3X4 INTERCEED (GAUZE/BANDAGES/DRESSINGS) IMPLANT
BENZOIN TINCTURE PRP APPL 2/3 (GAUZE/BANDAGES/DRESSINGS) ×1 IMPLANT
CHLORAPREP W/TINT 26 (MISCELLANEOUS) ×2 IMPLANT
CLAMP UMBILICAL CORD (MISCELLANEOUS) ×1 IMPLANT
CLOTH BEACON ORANGE TIMEOUT ST (SAFETY) ×1 IMPLANT
DRSG OPSITE POSTOP 4X10 (GAUZE/BANDAGES/DRESSINGS) ×1 IMPLANT
ELECT REM PT RETURN 9FT ADLT (ELECTROSURGICAL) ×1
ELECTRODE REM PT RTRN 9FT ADLT (ELECTROSURGICAL) ×1 IMPLANT
EXTRACTOR VACUUM M CUP 4 TUBE (SUCTIONS) IMPLANT
GAUZE PAD ABD 7.5X8 STRL (GAUZE/BANDAGES/DRESSINGS) IMPLANT
GAUZE SPONGE 4X4 12PLY STRL LF (GAUZE/BANDAGES/DRESSINGS) IMPLANT
GLOVE BIO SURGEON STRL SZ7.5 (GLOVE) ×1 IMPLANT
GLOVE BIOGEL PI IND STRL 7.0 (GLOVE) ×1 IMPLANT
GLOVE BIOGEL PI IND STRL 7.5 (GLOVE) ×1 IMPLANT
GOWN STRL REUS W/TWL LRG LVL3 (GOWN DISPOSABLE) ×2 IMPLANT
KIT ABG SYR 3ML LUER SLIP (SYRINGE) IMPLANT
NDL HYPO 25X5/8 SAFETYGLIDE (NEEDLE) IMPLANT
NEEDLE HYPO 25X5/8 SAFETYGLIDE (NEEDLE) IMPLANT
NS IRRIG 1000ML POUR BTL (IV SOLUTION) ×1 IMPLANT
PACK C SECTION WH (CUSTOM PROCEDURE TRAY) ×1 IMPLANT
PAD OB MATERNITY 4.3X12.25 (PERSONAL CARE ITEMS) ×1 IMPLANT
RTRCTR C-SECT PINK 25CM LRG (MISCELLANEOUS) ×1 IMPLANT
STRIP CLOSURE SKIN 1/2X4 (GAUZE/BANDAGES/DRESSINGS) ×1 IMPLANT
SUT CHROMIC 2 0 CT 1 (SUTURE) ×1 IMPLANT
SUT MNCRL 0 VIOLET CTX 36 (SUTURE) ×1 IMPLANT
SUT MNCRL AB 3-0 PS2 27 (SUTURE) ×1 IMPLANT
SUT PLAIN 2 0 XLH (SUTURE) ×1 IMPLANT
SUT VIC AB 0 CT1 36 (SUTURE) ×1 IMPLANT
SUT VIC AB 0 CTX36XBRD ANBCTRL (SUTURE) ×3 IMPLANT
SUT VIC AB 2-0 SH 27XBRD (SUTURE) IMPLANT
TOWEL OR 17X24 6PK STRL BLUE (TOWEL DISPOSABLE) ×1 IMPLANT
TRAY FOLEY W/BAG SLVR 14FR LF (SET/KITS/TRAYS/PACK) ×1 IMPLANT
WATER STERILE IRR 1000ML POUR (IV SOLUTION) ×1 IMPLANT

## 2024-01-17 NOTE — Progress Notes (Signed)
Labor Progress Note  Heather Hall, 18 y.o., G1P0, with an IUP @ [redacted]w[redacted]d, presented for female presenting for contractions. GBS-. LR Female.   Subjective: In to push again , pushed for another hour, fetal tachy noted occ  but not baseline, fluid bolus given, maternal temp 99-100, maternal heart rate increased only during pushing, caput noted during pushing to 2+, but fetal head still 1+ and no fetal descent noted despite pushing for a total of three hours, discussed risk of shoulder dystocia again and cat 2 still during pushing and R/B/A of PCS, pt given time to discussed with partner and questions answered, pt and partner verbalized desire to proceed with PCS, DR Su Hilt notified.   Patient was consented for blood products.  The patient is aware that bleeding may result in the need for a blood transfusion which includes risk of transmission of HIV (1:2 million), Hepatitis C (1:2 million), and Hepatitis B (1:200 thousand) and transfusion reaction.  Patient voiced understanding of the above risks as well as understanding of indications for blood transfusion.   Patient Active Problem List   Diagnosis Date Noted   Indication for care in labor or delivery 01/16/2024   Innocent heart murmur 10/12/2018   Hx of cholecystectomy 10/12/2018   Biliary dyskinesia 11/11/2017   Patellar tendinitis 02/18/2017   Pain of finger of right hand 03/17/2013   Objective: BP 118/73   Pulse 86   Temp 100 F (37.8 C) (Axillary)   Resp 18   Wt 74.6 kg   LMP 04/13/2023 (Exact Date)   SpO2 97%   BMI 31.09 kg/m  I/O last 3 completed shifts: In: -  Out: 800 [Urine:800] No intake/output data recorded. NST: FHR baseline 145 bpm, Variability: moderate, Accelerations:present, Decelerations:  Present  late decles noted  = Cat 2/Reactive with scalp stimulations CTX:  regular, every 3 minutes Uterus gravid, soft non tender, moderate to palpate with contractions.  SVE:  Dilation: 10 Effacement (%): 100 Station: Plus  1 Exam by:: Agness Sibrian CNM Pitocin at 71mUn/min  Assessment:  Heather Hall, 18 y.o., G1P0, with an IUP @ [redacted]w[redacted]d, presented for female presenting for contractions. GBS-. LR Female. Complete @ 2003, s/p pushing for 3 hours, with good effort, no descent, suspect CPD.  NRFHT with pushing, pt and partner opting for PCS Patient Active Problem List   Diagnosis Date Noted   Indication for care in labor or delivery 01/16/2024   Innocent heart murmur 10/12/2018   Hx of cholecystectomy 10/12/2018   Biliary dyskinesia 11/11/2017   Patellar tendinitis 02/18/2017   Pain of finger of right hand 03/17/2013   NICHD: Category 2/1 when not pushing  Category 2 with active intrauterine resuscitative measures maternal position change, fluid bolus.  Membranes:  AROM mod mec @ 1343 on 1/18, watching for signs of chorio.   Induction:    Cytotec xN/A  Foley Bulb: N/A  Pitocin - N/A  Pain management:               IV pain management: x PRN  Nitrous: PRN             Epidural placement:  at 0837 on 1/18  GBS Negative    Plan: Proceed with PCS for NRFHT SCD Bicitra Azith and ancef NPO Routine [facility] OP orders placed.     Md Su Hilt aware of plan and verbalized agreement.   [facility] CNM, FNP-C, PMHNP-BC  [address]  [place], [place] [address]  Cell: [phone]  Office Phone: [phone] Fax:  [phone] 01/17/2024  12:35 AM

## 2024-01-17 NOTE — Transfer of Care (Signed)
Immediate Anesthesia Transfer of Care Note  Patient: Heather Hall  Procedure(s) Performed: CESAREAN SECTION (Abdomen)  Patient Location: PACU  Anesthesia Type:Epidural  Level of Consciousness: awake, alert , and oriented  Airway & Oxygen Therapy: Patient Spontanous Breathing  Post-op Assessment: Report given to RN and Post -op Vital signs reviewed and stable  Post vital signs: Reviewed  Last Vitals:  Vitals Value Taken Time  BP 115/79 01/17/24 0247  Temp    Pulse 109 01/17/24 0251  Resp 18 01/17/24 0251  SpO2 99 % 01/17/24 0251  Vitals shown include unfiled device data.  Last Pain:  Vitals:   01/17/24 0100  TempSrc:   PainSc: 0-No pain         Complications: No notable events documented.

## 2024-01-17 NOTE — Anesthesia Postprocedure Evaluation (Signed)
Anesthesia Post Note  Patient: Heather Hall  Procedure(s) Performed: CESAREAN SECTION (Abdomen)     Patient location during evaluation: Mother Baby Anesthesia Type: Epidural Level of consciousness: oriented and awake and alert Pain management: pain level controlled Vital Signs Assessment: post-procedure vital signs reviewed and stable Respiratory status: spontaneous breathing and respiratory function stable Cardiovascular status: blood pressure returned to baseline and stable Postop Assessment: no headache, no backache, no apparent nausea or vomiting and able to ambulate Anesthetic complications: no   No notable events documented.  Last Vitals:  Vitals:   01/17/24 0833 01/17/24 1218  BP: (!) 101/51 (!) 99/47  Pulse: 68 66  Resp: 18 18  Temp: 36.9 C 36.7 C  SpO2: 97% 99%    Last Pain:  Vitals:   01/17/24 1218  TempSrc: Oral  PainSc:    Pain Goal:                   Trevor Iha

## 2024-01-17 NOTE — Op Note (Signed)
Cesarean Section Procedure Note  Indications: P0 at 24 6/7wks admitted in labor with FTD  Pre-operative Diagnosis: 1.39 6/7wks 2.FTD   Post-operative Diagnosis: 1.39 6/7wks 2.FTD  Procedure: Primary Low Transverse Cesarean Section  Surgeon: Osborn Coho, MD    Assistants: Dale Gulf, CNM  Anesthesia: Regional  Anesthesiologist: Trevor Iha, MD   Procedure Details  The patient was taken to the operating room secondary to FTD after the risks, benefits, complications, treatment options, and expected outcomes were discussed with the patient.  The patient concurred with the proposed plan, giving informed consent which was signed and witnessed. The patient was taken to Operating Room B, identified as Heather Hall and the procedure verified as C-Section Delivery. A Time Out was held and the above information confirmed.  After induction of anesthesia by obtaining a spinal, the patient was prepped and draped in the usual sterile manner. A Pfannenstiel skin incision was made and carried down through the subcutaneous tissue to the underlying layer of fascia.  The fascia was incised bilaterally and extended transversely bilaterally with the Mayo scissors. Kocher clamps were placed on the inferior aspect of the fascial incision and the underlying rectus muscle was separated from the fascia. The same was done on the superior aspect of the fascial incision.  The peritoneum was identified, entered bluntly and extended manually.  An Alexis self-retaining retractor was placed.  The utero-vesical peritoneal reflection was incised transversely and the bladder flap was bluntly freed from the lower uterine segment. A low transverse uterine incision was made with the scalpel and extended bilaterally with the bandage scissors.  The infant was delivered in vertex position without difficulty.  After the umbilical cord was clamped and cut, the infant was handed to the awaiting pediatricians.  Cord blood was  obtained for evaluation.  The placenta was removed intact and appeared to be within normal limits. The uterus was cleared of all clots and debris. The uterine incision was closed with running interlocking sutures of 0 Vicryl and a second imbricating layer was performed as well.   Bilateral tubes and ovaries appeared to be within normal limits.  Good hemostasis was noted.  Copious irrigation was performed until clear.  The peritoneum was repaired with 2-0 chromic via a running suture.  The fascia was reapproximated with a running suture of 0 Vicryl.  The skin was reapproximated with a subcuticular suture of 3-0 monocryl.  Steristrips were applied.  Instrument, sponge, and needle counts were correct prior to abdominal closure and at the conclusion of the case.  The patient was awaiting transfer to the recovery room in good condition.  Findings: Live female infant with Apgars 8 at one minute and 9 at five minutes.  Normal appearing bilateral ovaries and fallopian tubes were noted.  Estimated Blood Loss:  1376 ml         Drains: foley to gravity 250 cc         Total IV Fluids: 2200 ml         Specimens to Pathology: Placenta         Complications:  None; patient tolerated the procedure well.         Disposition: PACU - hemodynamically stable.         Condition: stable  Attending Attestation: I performed the procedure.  I was present and scrubbed and the assistant was required due to complexity of anatomy.

## 2024-01-17 NOTE — Lactation Note (Signed)
This note was copied from a baby's chart. Lactation Consultation Note  Patient Name: Heather Hall'H Date: 01/17/2024 Age:20 hours Reason for consult: Initial assessment;1st time breastfeeding  P1, Baby sleeping in father's arms after recent feeding. Mother resting but was willing to learn hand expression. Drops easily expressed. Suggest calling for assistance with next feeding. Feed on demand with cues.  Goal 8-12+ times per day after first 24 hrs.  Place baby STS if not cueing.   Maternal Data Has patient been taught Hand Expression?: Yes Does the patient have breastfeeding experience prior to this delivery?: No  Feeding Mother's Current Feeding Choice: Breast Milk  Interventions Interventions: Breast feeding basics reviewed;Education;LC Services brochure;Hand express  Discharge Pump: Personal;Hands Free Heather Hall)  Consult Status Consult Status: Follow-up Date: 01/17/24 Follow-up type: In-patient    Dahlia Byes [facility] 01/17/2024, 11:45 AM

## 2024-01-17 NOTE — Lactation Note (Signed)
This note was copied from a baby's chart. Lactation Consultation Note  Patient Name: Heather Hall [id number] Date: 01/17/2024 Age:20 hours Reason for consult: Follow-up assessment;1st time breastfeeding;Mother's request  P1, Mother requested assistance with latching. Mother hand expressed good flow of colostrum. Assisted with latching in football hold.  Noted intermittent swallows.  Encouraged mother to compress her breast to keep him active.  Feed on demand with cues.  Goal 8-12+ times per day after first 24 hrs.   Discussed benefits of breastfeeding and provided a manual  pump. Suggest calling for help as needed.  Maternal Data Has patient been taught Hand Expression?: Yes Does the patient have breastfeeding experience prior to this delivery?: No  Feeding Mother's Current Feeding Choice: Breast Milk  LATCH Score Latch: Grasps breast easily, tongue down, lips flanged, rhythmical sucking.  Audible Swallowing: A few with stimulation  Type of Nipple: Everted at rest and after stimulation  Comfort (Breast/Nipple): Soft / non-tender  Hold (Positioning): Assistance needed to correctly position infant at breast and maintain latch.  LATCH Score: 8   Lactation Tools Discussed/Used Tools: Pump;Flanges Flange Size: 21 Breast pump type: Manual Pump Education: Setup, frequency, and cleaning;Milk Storage Reason for Pumping: PRN Pumping frequency: PRN  Interventions Interventions: Breast feeding basics reviewed;Assisted with latch;Skin to skin;Hand express;Breast compression;Adjust position;Support pillows;Education;Hand pump  Discharge Pump: Personal;Hands Free Margarette [place])  Consult Status Consult Status: Follow-up Date: 01/18/24 Follow-up type: In-patient    Hardie Pulley  RN IBCLC 01/17/2024, 1:57 PM

## 2024-01-18 ENCOUNTER — Encounter (HOSPITAL_COMMUNITY): Payer: Self-pay | Admitting: Obstetrics and Gynecology

## 2024-01-18 ENCOUNTER — Inpatient Hospital Stay (HOSPITAL_COMMUNITY): Payer: Medicaid Other

## 2024-01-18 ENCOUNTER — Other Ambulatory Visit: Payer: Self-pay

## 2024-01-18 MED ORDER — POLYSACCHARIDE IRON COMPLEX 150 MG PO CAPS
150.0000 mg | ORAL_CAPSULE | ORAL | Status: DC
  Administered 2024-01-18: 150 mg via ORAL
  Filled 2024-01-18: qty 1

## 2024-01-18 NOTE — Progress Notes (Signed)
Subjective: Postpartum Day 1: Cesarean Delivery Patient reports tolerating PO and no problems voiding.  She has ambulated without difficulty, denies lightheadedness or dizziness.   Objective: Vital signs in last 24 hours: Temp:  [97.7 F (36.5 C)-98.1 F (36.7 C)] 98.1 F (36.7 C) (01/20 0528) Pulse Rate:  [66-71] 71 (01/20 0528) Resp:  [18] 18 (01/19 1218) BP: (90-99)/(47-52) 94/48 (01/20 0528) SpO2:  [98 %-99 %] 98 % (01/20 0528)  Physical Exam:  General: alert, cooperative, and no distress Lochia: appropriate Uterine Fundus: firm Incision: with pressure dressing, clean, dry and intact.  DVT Evaluation: No evidence of DVT seen on physical exam. Calf/Ankle edema is present.  Recent Labs    01/16/24 0804 01/17/24 0520  HGB 11.3* 8.6*  HCT 33.9* 25.6*    Assessment/Plan: 18 y/o G1P1 POD # 1 Status post Cesarean section. Doing well postoperatively.  Continue current care. Iron tables for anemia-  acute blood loss anemia.  Patient desires circumcision of her baby boy and this was done after discussing the risks, benefits and alternatives of circumcision.  Prescilla Sours, MD 01/18/2024, 11:07 AM

## 2024-01-18 NOTE — Lactation Note (Signed)
This note was copied from a baby's chart. Lactation Consultation Note  Patient Name: Heather Hall [id number] Date: 01/18/2024 Age:20 hours Reason for consult: Follow-up assessment;Mother's request;1st time breastfeeding  P1 mom of 62 hour old infant consulted at mother's request. Mom reports concern with infant not being ready to eat at 2 hours since last feed and yielding minimal volume during manual pumping. LC reassured and re-educated on normal infant feeding intervals and patterns within 24 hours. Advised to try and offer baby the breast in another hour after changing the diaper. LC discussed importance of infant safe sleeping as infant was propped up on the boppy pillow with chin touching chest. Recommended infant to lie flat on his back when sleeping. Reviewed use of manual pump which mom verbalized not having any yield. Parents requested to be taught how to use the electric pump. LC gathered supplies and placed size 21mm flanges on the pump and discussed use of initiation mode, pumping frequency, timing and cleaning of pump parts. CDC guidelines for milk storage and cleaning left at the bedside. Discussed for parents to call out for assistance with latching or pumping when needed.  Parents verbalized understanding.  Maternal Data    Feeding Mother's Current Feeding Choice: Breast Milk  LATCH Score  Infant asleep during LC visit.  Lactation Tools Discussed/Used Tools: Pump Flange Size: 21 Breast pump type: Double-Electric Breast Pump Pump Education: Setup, frequency, and cleaning;Milk Storage Reason for Pumping: patient choice; reports not yielding with manual Pumping frequency: initiated  Interventions Interventions: DEBP;Breast feeding basics reviewed;Hand express;Education;CDC Guidelines for Breast Pump Cleaning  Discharge Pump: DEBP  Consult Status Consult Status: Follow-up Date: 01/19/24 Follow-up type: In-patient    Su Grand 01/18/2024, 5:01 PM

## 2024-01-18 NOTE — Lactation Note (Signed)
This note was copied from a baby's chart. Lactation Consultation Note  Patient Name: Heather Hall [id number] Date: 01/18/2024 Age:20 hours Reason for consult: Follow-up assessment;1st time breastfeeding  P1, Baby fussy.  Parents tired.  Assisted with latching baby with ease in football hold.  Baby calmed with feeding.  Suggest calling for help today if needed with feeding.    Maternal Data Has patient been taught Hand Expression?: Yes  Feeding Mother's Current Feeding Choice: Breast Milk  LATCH Score Latch: Grasps breast easily, tongue down, lips flanged, rhythmical sucking.  Audible Swallowing: A few with stimulation  Type of Nipple: Everted at rest and after stimulation  Comfort (Breast/Nipple): Soft / non-tender  Hold (Positioning): Assistance needed to correctly position infant at breast and maintain latch.  LATCH Score: 8   Lactation Tools Discussed/Used    Interventions Interventions: Education;Assisted with latch  Discharge    Consult Status Consult Status: Follow-up Date: 01/19/24 Follow-up type: In-patient    Dahlia Byes [facility] 01/18/2024, 8:54 AM

## 2024-01-19 LAB — SURGICAL PATHOLOGY

## 2024-01-19 MED ORDER — IBUPROFEN 600 MG PO TABS: 600.0000 mg | ORAL_TABLET | Freq: Four times a day (QID) | ORAL | 0 refills | Status: DC

## 2024-01-19 MED ORDER — ACETAMINOPHEN 500 MG PO TABS: 1000.0000 mg | ORAL_TABLET | Freq: Four times a day (QID) | ORAL | 0 refills | Status: DC

## 2024-01-19 MED ORDER — OXYCODONE HCL 5 MG PO TABS: 5.0000 mg | ORAL_TABLET | ORAL | 0 refills | Status: DC | PRN

## 2024-01-19 MED ORDER — PRENATAL MULTIVITAMIN CH: 1.0000 | ORAL_TABLET | Freq: Every day | ORAL | 6 refills | Status: AC

## 2024-01-19 NOTE — Discharge Summary (Signed)
Postpartum Discharge Summary  Date of Service updated1/21/25     Patient Name: Heather Hall DOB: [birth year] MRN: [id number]  Date of admission: 01/16/2024 Delivery date:01/17/2024 Delivering provider: Osborn Coho Date of discharge: 01/19/2024  Admitting diagnosis: Indication for care in labor or delivery [O75.9] Intrauterine pregnancy: [redacted]w[redacted]d     Secondary diagnosis:  Principal Problem:   Indication for care in labor or delivery Active Problems:   S/P cesarean section   PPH (postpartum hemorrhage)   Acute blood loss anemia  Additional problems: PPH    Discharge diagnosis: Term Pregnancy Delivered and PPH                                              Post partum procedures:   Augmentation: Pitocin Complications: Hemorrhage>1071mL  Hospital course: Onset of Labor With Unplanned C/S   18 y.o. yo G1P1001 at [redacted]w[redacted]d was admitted in Active Labor on 01/16/2024. Patient had a labor course significant for FTD. The patient went for cesarean section due to Arrest of Descent. Delivery details as follows: Membrane Rupture Time/Date: 1:43 PM,01/16/2024  Delivery Method:C-Section, Low Transverse Operative Delivery:N/A Details of operation can be found in separate operative note. Patient had a postpartum course complicated byFTD.  She is ambulating,tolerating a regular diet, passing flatus, and urinating well.  Patient is discharged home in stable condition 01/19/24.  Newborn Data: Birth date:01/17/2024 Birth time:1:48 AM Gender:Female Living status:Living Apgars:8 ,9  Weight:3650 g  Magnesium Sulfate received: No BMZ received: No Rhophylac:Yes MMR:No T-DaP:   Flu: No RSV Vaccine received: No Transfusion:No Immunizations administered: Immunization History  Administered Date(s) Administered   DTaP 06/07/2004, 08/06/2004, 10/08/2004, 07/17/2005, 05/29/2008   HIB (PRP-OMP) 06/07/2004, 08/06/2004, 10/08/2004, 04/29/2005   HPV 9-valent 08/15/2016, 02/05/2017   Hepatitis B  July 23, 2004, 06/07/2004, 08/06/2004, 10/08/2004   Hpv-Unspecified 08/15/2016, 02/05/2017   IPV 06/07/2004, 08/06/2004, 10/08/2004, 05/29/2008   Influenza,Quad,Nasal, Live 10/10/2013   Influenza,inj,Quad PF,6+ Mos 11/07/2015   MMR 04/29/2005, 05/29/2008   Meningococcal Conjugate 08/15/2016   Pneumococcal Conjugate-13 06/07/2004, 08/06/2004, 10/08/2004, 04/29/2005   Tdap 08/15/2016   Varicella 04/29/2005, 05/29/2008    Physical exam  Vitals:   01/18/24 1521 01/18/24 2102 01/19/24 0522 01/19/24 1119  BP: 94/61 101/61 96/64 (!) 104/59  Pulse: 62 79 78 66  Resp: 18 18 14    Temp: 98.5 F (36.9 C) 97.9 F (36.6 C) 97.8 F (36.6 C)   TempSrc: Oral Oral Oral   SpO2: 100% 100% 99%   Weight:       General: alert and cooperative Lochia: appropriate Uterine Fundus: firm Incision: Dressing is clean, dry, and intact DVT Evaluation: No evidence of DVT seen on physical exam. Labs: Lab Results  Component Value Date   WBC 21.5 (H) 01/17/2024   HGB 8.6 (L) 01/17/2024   HCT 25.6 (L) 01/17/2024   MCV 81.8 01/17/2024   PLT 171 01/17/2024      Latest Ref Rng & Units 11/02/2017   11:20 AM  CMP  Glucose 65 - 99 mg/dL 88   BUN 6 - 20 mg/dL 5   Creatinine 7.82 - 9.56 mg/dL 2.13   Sodium 086 - 578 mmol/L 136   Potassium 3.5 - 5.1 mmol/L 3.8   Chloride 101 - 111 mmol/L 104   CO2 22 - 32 mmol/L 24   Calcium 8.9 - 10.3 mg/dL 9.4   Total Protein 6.5 - 8.1 g/dL 7.1  Total Bilirubin 0.3 - 1.2 mg/dL 0.8   Alkaline Phos 50 - 162 U/L 125   AST 15 - 41 U/L 17   ALT 14 - 54 U/L 10    Edinburgh Score:    01/18/2024    5:22 PM  Edinburgh Postnatal Depression Scale Screening Tool  I have been able to laugh and see the funny side of things. 0  I have looked forward with enjoyment to things. 0  I have blamed myself unnecessarily when things went wrong. 0  I have been anxious or worried for no good reason. 0  I have felt scared or panicky for no good reason. 0  Things have been getting on top of  me. 0  I have been so unhappy that I have had difficulty sleeping. 0  I have felt sad or miserable. 0  I have been so unhappy that I have been crying. 0  The thought of harming myself has occurred to me. 0  Edinburgh Postnatal Depression Scale Total 0      After visit meds:  Allergies as of 01/19/2024   No Known Allergies      Medication List     TAKE these medications    acetaminophen 500 MG tablet Commonly known as: TYLENOL Take 2 tablets (1,000 mg total) by mouth every 6 (six) hours.   ibuprofen 600 MG tablet Commonly known as: ADVIL Take 1 tablet (600 mg total) by mouth every 6 (six) hours. What changed:  when to take this reasons to take this   oxyCODONE 5 MG immediate release tablet Commonly known as: Oxy IR/ROXICODONE Take 1-2 tablets (5-10 mg total) by mouth every 4 (four) hours as needed for moderate pain (pain score 4-6).   prenatal multivitamin Tabs tablet Take 1 tablet by mouth daily at 12 noon. Start taking on: January 20, 2024         Discharge home in stable condition Infant Feeding: Bottle and Breast Infant Disposition:home with mother Discharge instruction: per After Visit Summary and Postpartum booklet. Activity: Advance as tolerated. Pelvic rest for 6 weeks.  Diet: routine diet Anticipated Birth Control: Unsure Postpartum Appointment:6 weeks Additional Postpartum F/U:    Future Appointments:No future appointments. Follow up Visit:  Follow-up Information     [facility]. Schedule an appointment as soon as possible for a visit in 1 week(s).   Specialty: Obstetrics and Gynecology Contact information: [address] [place] [address] [phone]                    01/19/2024 Michael Litter, MD

## 2024-01-19 NOTE — Lactation Note (Signed)
This note was copied from a baby's chart. Lactation Consultation Note  Patient Name: Heather Hall Date: 01/19/2024 Age:20 hours Reason for consult: Follow-up assessment;Primapara;1st time breastfeeding;Term;Infant weight loss;Nipple pain/trauma;Engorgement  P1- MOB reports that she has been breastfeeding infant with every feeding, and then immediately pumping for 15 minutes to collect milk. MOB reports that it pinches a little when infant nurses. LC reviewed how to achieve a deep latch. When [facility] performed an oral assessment, LC noted that infant bites sometimes instead of nutritive sucking. MOB reports that she has been pumping off 6 ish ounces collectively. MOB had just finished nursing and pumping an hour before LC came into the room, but when a breast assessment was done MOB was still very engorged. MOB's breasts were painful to the touch as well. LC reviewed engorgement protocol.  LC assisted MOB with pumping for 15 minutes. MOB collected 4 ounces at this time. MOB was still full, but not engorged. LC noted two spots were the ducts are clogged. LC encouraged MOB to keep an eye on those spots when she is feeding/pumping/ LC reviewed the signs and symptoms of mastitis just in case. MOB denied having further questions or concerns. LC encouraged MOB to call for further assistance as needed. RN updated about MOB's breasts.  Maternal Data Has patient been taught Hand Expression?: No Does the patient have breastfeeding experience prior to this delivery?: No  Feeding Mother's Current Feeding Choice: Breast Milk  Lactation Tools Discussed/Used Tools: Pump;Flanges Flange Size: 21 Breast pump type: Double-Electric Breast Pump;Manual Pump Education: Setup, frequency, and cleaning;Milk Storage Reason for Pumping: engorgement Pumping frequency: 15-20 min as needed or until soft Pumped volume: 5 mL (ounces)  Interventions Interventions: Breast feeding basics reviewed;Breast  massage;Expressed milk;Hand pump;DEBP;Ice;Education;LC Services brochure  Discharge Discharge Education: Engorgement and breast care;Warning signs for feeding baby Pump: DEBP;Personal  Consult Status Consult Status: Complete Date: 01/19/24    Dema Severin BS, IBCLC 01/19/2024, 5:43 PM

## 2024-01-25 ENCOUNTER — Inpatient Hospital Stay (HOSPITAL_COMMUNITY)
Admission: RE | Admit: 2024-01-25 | Payer: Medicaid Other | Source: Home / Self Care | Admitting: Obstetrics & Gynecology

## 2024-01-25 ENCOUNTER — Inpatient Hospital Stay (HOSPITAL_COMMUNITY): Payer: Medicaid Other

## 2024-01-28 ENCOUNTER — Telehealth (HOSPITAL_COMMUNITY): Payer: Self-pay | Admitting: *Deleted

## 2024-01-28 NOTE — Telephone Encounter (Signed)
01/28/2024  Name: TRU LEOPARD MRN: [id number] DOB: 2006-08-10  Reason for Call:  Transition of Care Hospital Discharge Call  Contact Status: Patient Contact Status: Complete  Language assistant needed:          Follow-Up Questions: Do You Have Any Concerns About Your Health As You Heal From Delivery?: No Do You Have Any Concerns About Your Infants Health?: No  Edinburgh Postnatal Depression Scale:  In the Past 7 Days: I have been able to laugh and see the funny side of things.: As much as I always could I have looked forward with enjoyment to things.: As much as I ever did I have blamed myself unnecessarily when things went wrong.: No, never I have been anxious or worried for no good reason.: No, not at all I have felt scared or panicky for no good reason.: No, not at all Things have been getting on top of me.: No, I have been coping as well as ever I have been so unhappy that I have had difficulty sleeping.: Not at all I have felt sad or miserable.: No, not at all I have been so unhappy that I have been crying.: No, never The thought of harming myself has occurred to me.: Never Inocente Salles Postnatal Depression Scale Total: 0  PHQ2-9 Depression Scale:     Discharge Follow-up: Edinburgh score requires follow up?: No Patient was advised of the following resources:: Breastfeeding Support Group, Support Group  Post-discharge interventions: Reviewed Newborn Safe Sleep Practices  Signature Deforest Hoyles, RN, 01/28/24, [phone]

## 2024-06-02 LAB — OB RESULTS CONSOLE GC/CHLAMYDIA: Chlamydia: NEGATIVE

## 2024-06-02 LAB — OB RESULTS CONSOLE HEPATITIS B SURFACE ANTIGEN: Hepatitis B Surface Ag: NEGATIVE

## 2024-06-02 LAB — OB RESULTS CONSOLE ANTIBODY SCREEN: Antibody Screen: NEGATIVE

## 2024-06-02 LAB — HEPATITIS C ANTIBODY: HCV Ab: NEGATIVE

## 2024-06-02 LAB — OB RESULTS CONSOLE HIV ANTIBODY (ROUTINE TESTING): HIV: NONREACTIVE

## 2024-06-02 LAB — OB RESULTS CONSOLE RUBELLA ANTIBODY, IGM: Rubella: IMMUNE

## 2024-06-02 LAB — OB RESULTS CONSOLE RPR: RPR: NONREACTIVE

## 2024-06-29 DIAGNOSIS — R1011 Right upper quadrant pain: Secondary | ICD-10-CM | POA: Diagnosis not present

## 2024-07-07 DIAGNOSIS — N1330 Unspecified hydronephrosis: Secondary | ICD-10-CM | POA: Diagnosis not present

## 2024-07-07 DIAGNOSIS — O26839 Pregnancy related renal disease, unspecified trimester: Secondary | ICD-10-CM | POA: Diagnosis not present

## 2024-07-20 ENCOUNTER — Other Ambulatory Visit: Payer: Self-pay | Admitting: Obstetrics and Gynecology

## 2024-07-20 DIAGNOSIS — R109 Unspecified abdominal pain: Secondary | ICD-10-CM

## 2024-07-21 ENCOUNTER — Inpatient Hospital Stay
Admission: RE | Admit: 2024-07-21 | Discharge: 2024-07-21 | Discharge status: Home / Self Care | Source: Ambulatory Visit | Attending: Obstetrics and Gynecology | Admitting: Obstetrics and Gynecology

## 2024-07-21 DIAGNOSIS — R109 Unspecified abdominal pain: Secondary | ICD-10-CM

## 2024-08-04 DIAGNOSIS — Z3A22 22 weeks gestation of pregnancy: Secondary | ICD-10-CM | POA: Diagnosis not present

## 2024-08-04 DIAGNOSIS — Z363 Encounter for antenatal screening for malformations: Secondary | ICD-10-CM | POA: Diagnosis not present

## 2024-09-30 ENCOUNTER — Other Ambulatory Visit: Payer: Self-pay | Admitting: Obstetrics and Gynecology

## 2024-11-01 DIAGNOSIS — Z3A33 33 weeks gestation of pregnancy: Secondary | ICD-10-CM | POA: Diagnosis not present

## 2024-11-01 DIAGNOSIS — Z98891 History of uterine scar from previous surgery: Secondary | ICD-10-CM | POA: Diagnosis not present

## 2024-11-01 DIAGNOSIS — Z364 Encounter for antenatal screening for fetal growth retardation: Secondary | ICD-10-CM | POA: Diagnosis not present

## 2024-11-01 DIAGNOSIS — O09892 Supervision of other high risk pregnancies, second trimester: Secondary | ICD-10-CM | POA: Diagnosis not present

## 2024-11-25 ENCOUNTER — Encounter (HOSPITAL_COMMUNITY): Payer: Self-pay | Admitting: *Deleted

## 2024-11-25 NOTE — Patient Instructions (Signed)
 Heather Hall  11/25/2024   Your procedure is scheduled on:  12.12.2025  Arrive at 1230 at Entrance C on [address] [facility]  and [employer]. You are invited to use the FREE valet parking or use the Visitor's parking deck.  Pick up the phone at the desk and dial [phone].  Call this number if you have problems the morning of surgery: [phone]  Remember:   Do not eat food:(After Midnight) Desps de medianoche.  You may drink clear liquids until  __1030___.  Clear liquids means a liquid you can see thru.  It can have color such as Cola or Kool aid.  Tea is OK and coffee as long as no milk or creamer of any kind.  Take these medicines the morning of surgery with A SIP OF WATER :  none   Do not wear jewelry, make-up or nail polish.  Do not wear lotions, powders, or perfumes. Do not wear deodorant.  Do not shave 48 hours prior to surgery.  Do not bring valuables to the hospital.  [facility] is not   responsible for any belongings or valuables brought to the hospital.  Contacts, dentures or bridgework may not be worn into surgery.  Leave suitcase in the car. After surgery it may be brought to your room.  For patients admitted to the hospital, checkout time is 11:00 AM the day of              discharge.      Please read over the following fact sheets that you were given:     Preparing for Surgery

## 2024-11-28 ENCOUNTER — Encounter (HOSPITAL_COMMUNITY): Payer: Self-pay

## 2024-11-28 ENCOUNTER — Telehealth (HOSPITAL_COMMUNITY): Payer: Self-pay | Admitting: *Deleted

## 2024-11-28 NOTE — Telephone Encounter (Signed)
 Preadmission screen

## 2024-11-29 ENCOUNTER — Telehealth (HOSPITAL_COMMUNITY): Payer: Self-pay | Admitting: *Deleted

## 2024-11-29 NOTE — Telephone Encounter (Signed)
 Preadmission screen

## 2024-11-30 ENCOUNTER — Telehealth (HOSPITAL_COMMUNITY): Payer: Self-pay | Admitting: *Deleted

## 2024-11-30 LAB — OB RESULTS CONSOLE GBS: GBS: NEGATIVE

## 2024-11-30 NOTE — Telephone Encounter (Signed)
 Preadmission screen

## 2024-12-01 ENCOUNTER — Encounter (HOSPITAL_COMMUNITY): Payer: Self-pay

## 2024-12-05 ENCOUNTER — Encounter (HOSPITAL_COMMUNITY): Payer: Self-pay

## 2024-12-05 ENCOUNTER — Inpatient Hospital Stay (HOSPITAL_COMMUNITY): Admission: AD | Admit: 2024-12-05 | Discharge: 2024-12-05 | Disposition: A | Discharge status: Home / Self Care

## 2024-12-05 ENCOUNTER — Other Ambulatory Visit: Payer: Self-pay

## 2024-12-05 DIAGNOSIS — O479 False labor, unspecified: Secondary | ICD-10-CM | POA: Diagnosis not present

## 2024-12-05 DIAGNOSIS — Z3A38 38 weeks gestation of pregnancy: Secondary | ICD-10-CM | POA: Diagnosis not present

## 2024-12-05 NOTE — MAU Note (Signed)
 MAU Labor Evaluation RN Medical Screening Exam: RN Assessment:  .Heather Hall is a 18 y.o. at [redacted]w[redacted]d here in MAU for evaluation of labor. See RN triage note.   Pain Score: 8  Pain Location: Abdomen  Cervical exam:  Dilation: 2.5 Effacement (%): 60 Station: -3 Presentation: Vertex Exam by:: GEANNIE Punter, RN  Fetal Monitoring: Baseline Rate (A): 135 bpm Variability: Moderate Accelerations: 15 x 15 Decelerations: None Contraction Frequency (min): 4.5-8.5  Vitals:   12/05/24 1343 12/05/24 1355  BP: 111/66 105/67  Pulse: 88 88  Resp: 20   Temp: 98.3 F (36.8 C)   SpO2: 99% 98%      Medical Decision Making & Provider Communication:  This RN has communicated with: Provider Name/Title: Wallace, PA & Rasch, NP    Plan of Care: Labor eval with SVE recheck in 2 hour(s)

## 2024-12-05 NOTE — Discharge Instructions (Signed)
 SABRA

## 2024-12-05 NOTE — MAU Note (Signed)
 MAU Labor Evaluation RN Medical Screening Exam: RN Assessment:  .Kimberli M Chenette is a 18 y.o. at [redacted]w[redacted]d here in MAU for evaluation of labor. See RN triage note.   Pain Score: 8  Pain Location: Abdomen  Cervical exam:  Dilation: 2.5 Effacement (%): 80 Station: -3 Presentation: Vertex Exam by:: GEANNIE Punter, RN  Fetal Monitoring: Baseline Rate (A): 130 bpm Variability: Moderate Accelerations: 15 x 15 Decelerations: None Contraction Frequency (min): 3.5-9  Vitals:   12/05/24 1355 12/05/24 1605  BP: 105/67 98/62  Pulse: 88 84  Resp:    Temp:    SpO2: 98% 99%      Medical Decision Making & Provider Communication:  This RN has communicated with: Provider Name/Title: Wallace, PA  Communicated with MAU provider SBAR report of labor evaluation. Also reviewed contraction pattern and that non-stress test is reactive.  Contraction Frequency (min): 3.5-9 has been documented with minimal cervical change over 2 hours not indicating active labor.  Patient denies any other complaints.  Based on this report provider has given order for discharge. A discharge order and diagnosis entered by a provider. Labor discharge precautions reviewed with patient and all questions answered. Patient verbalized understanding.   Plan of Care: Discharge home with strict labor precautions

## 2024-12-05 NOTE — MAU Note (Signed)
 Heather Hall is a 18 y.o. at [redacted]w[redacted]d here in MAU reporting: she's having ctxs since 0300, reports ctxs are 5 minutes apart.  Reports also having lots of pelvic pressure.  Denies VB and LOF.  Reports +FM.  LMP: NA Onset of complaint: today Pain score: 8 Vitals:   12/05/24 1343  BP: 111/66  Pulse: 88  Resp: 20  Temp: 98.3 F (36.8 C)  SpO2: 99%     FHT: 136 bpm  Lab orders placed from triage: None

## 2024-12-05 NOTE — MAU Provider Note (Signed)
 Patient was assessed for active labor and managed by nursing staff during this encounter. I have reviewed the chart and agree with the documentation and plan. I have also reviewed the NST for appropriate reactivity.  Fetal Tracing: Baseline: 130 Variability: moderate Accelerations: yes  Decelerations: none Toco: irregular uterine contractions every 4-8 minutes   Dilation: 2.5 Effacement (%): 80 Station: -3 Presentation: Vertex Exam by:: GEANNIE Punter, RN  Patient stable for discharge home with labor precautions.  Joesph Sear, PA-C  [facility] for [employer], [facility]

## 2024-12-07 ENCOUNTER — Encounter (HOSPITAL_COMMUNITY)
Admission: RE | Admit: 2024-12-07 | Discharge: 2024-12-07 | Disposition: A | Discharge status: Home / Self Care | Source: Ambulatory Visit | Attending: Obstetrics and Gynecology | Admitting: Obstetrics and Gynecology

## 2024-12-07 DIAGNOSIS — Z01812 Encounter for preprocedural laboratory examination: Secondary | ICD-10-CM | POA: Insufficient documentation

## 2024-12-07 LAB — CBC
HCT: 40.8 % (ref 36.0–46.0)
Hemoglobin: 12.7 g/dL (ref 12.0–15.0)
MCH: 24.6 pg — ABNORMAL LOW (ref 26.0–34.0)
MCHC: 31.1 g/dL (ref 30.0–36.0)
MCV: 79.1 fL — ABNORMAL LOW (ref 80.0–100.0)
Platelets: 239 K/uL (ref 150–400)
RBC: 5.16 MIL/uL — ABNORMAL HIGH (ref 3.87–5.11)
RDW: 16.4 % — ABNORMAL HIGH (ref 11.5–15.5)
WBC: 9.7 K/uL (ref 4.0–10.5)
nRBC: 0 % (ref 0.0–0.2)

## 2024-12-07 LAB — TYPE AND SCREEN
ABO/RH(D): O POS
Antibody Screen: NEGATIVE

## 2024-12-07 LAB — SYPHILIS: RPR W/REFLEX TO RPR TITER AND TREPONEMAL ANTIBODIES, TRADITIONAL SCREENING AND DIAGNOSIS ALGORITHM: RPR Ser Ql: NONREACTIVE

## 2024-12-08 ENCOUNTER — Inpatient Hospital Stay (HOSPITAL_COMMUNITY)
Admission: AD | Admit: 2024-12-08 | Discharge: 2024-12-11 | Disposition: A | Attending: Obstetrics and Gynecology | Admitting: Obstetrics and Gynecology

## 2024-12-08 ENCOUNTER — Other Ambulatory Visit: Payer: Self-pay

## 2024-12-08 ENCOUNTER — Encounter (HOSPITAL_COMMUNITY): Payer: Self-pay | Admitting: Obstetrics and Gynecology

## 2024-12-08 DIAGNOSIS — F17290 Nicotine dependence, other tobacco product, uncomplicated: Secondary | ICD-10-CM | POA: Diagnosis not present

## 2024-12-08 DIAGNOSIS — Z3A39 39 weeks gestation of pregnancy: Secondary | ICD-10-CM

## 2024-12-08 DIAGNOSIS — Z8249 Family history of ischemic heart disease and other diseases of the circulatory system: Secondary | ICD-10-CM

## 2024-12-08 DIAGNOSIS — O34211 Maternal care for low transverse scar from previous cesarean delivery: Secondary | ICD-10-CM | POA: Diagnosis not present

## 2024-12-08 DIAGNOSIS — O99334 Smoking (tobacco) complicating childbirth: Secondary | ICD-10-CM | POA: Diagnosis not present

## 2024-12-08 DIAGNOSIS — O34219 Maternal care for unspecified type scar from previous cesarean delivery: Secondary | ICD-10-CM | POA: Diagnosis not present

## 2024-12-08 LAB — CBC
HCT: 41.3 % (ref 36.0–46.0)
Hemoglobin: 12.7 g/dL (ref 12.0–15.0)
MCH: 24.6 pg — ABNORMAL LOW (ref 26.0–34.0)
MCHC: 30.8 g/dL (ref 30.0–36.0)
MCV: 79.9 fL — ABNORMAL LOW (ref 80.0–100.0)
Platelets: 245 K/uL (ref 150–400)
RBC: 5.17 MIL/uL — ABNORMAL HIGH (ref 3.87–5.11)
RDW: 16.5 % — ABNORMAL HIGH (ref 11.5–15.5)
WBC: 11.3 K/uL — ABNORMAL HIGH (ref 4.0–10.5)
nRBC: 0 % (ref 0.0–0.2)

## 2024-12-08 MED ORDER — PRENATAL MULTIVITAMIN CH: 1.0000 | ORAL_TABLET | Freq: Every day | ORAL | Status: DC

## 2024-12-08 MED ORDER — DOCUSATE SODIUM 100 MG PO CAPS: 100.0000 mg | ORAL_CAPSULE | Freq: Every day | ORAL | Status: DC

## 2024-12-08 MED ORDER — LACTATED RINGERS IV BOLUS
500.0000 mL | Freq: Once | INTRAVENOUS | Status: AC
  Administered 2024-12-08: 500 mL via INTRAVENOUS

## 2024-12-08 MED ORDER — LACTATED RINGERS IV SOLN: 125.0000 mL/h | INTRAVENOUS | Status: DC

## 2024-12-08 MED ORDER — LACTATED RINGERS IV SOLN: INTRAVENOUS | Status: DC

## 2024-12-08 MED ORDER — CALCIUM CARBONATE ANTACID 500 MG PO CHEW: 2.0000 | CHEWABLE_TABLET | ORAL | Status: DC | PRN

## 2024-12-08 MED ORDER — ACETAMINOPHEN 325 MG PO TABS: 650.0000 mg | ORAL_TABLET | ORAL | Status: DC | PRN

## 2024-12-08 NOTE — Plan of Care (Signed)

## 2024-12-08 NOTE — MAU Note (Signed)
 Heather Hall is a 18 y.o. at [redacted]w[redacted]d here in MAU reporting: During ROB pt was found to be 4cm dilated with Bulging BOW. Care team sent to MAU and will discuss POC. Pt has scheduled Rpt C/S tomorrow.   LMP:  Onset of complaint: Today Pain score: 3/10 There were no vitals filed for this visit.   FHT: 135  Lab orders placed from triage: none

## 2024-12-08 NOTE — H&P (Signed)
 Heather Hall is a 18 y.o. female, G2P1001, IUP at 38.6 weeks, presenting for RCS due to latent labor. Last ate at 1400 on 12/11, plans to admit to OBS and continue planned RCS for 12/12. H/O Anemia hgb 10.8 at NOB on PO Iron . Hydronephrosis moderate right sided was referred to urology. Vit D def. H/O PPH with 1 CS. Pt endorse + Fm. Denies vaginal leakage. Denies vaginal bleeding.    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 (Patient taking differently: Take 1,000 mg by mouth every 6 (six) hours as needed for mild pain (pain score 1-3) or moderate pain (pain score 4-6).) 30 tablet 0 Past Week   FEROSUL 325 (65 Fe) MG tablet Take 325 mg by mouth daily.   12/07/2024   Prenatal Vit-Fe Fumarate-FA (PRENATAL MULTIVITAMIN) TABS tablet Take 1 tablet by mouth daily at 12 noon. 30 tablet 6  12/07/2024    Past Medical History:  Diagnosis Date   Heart murmur    Murmur    Murmur      Medications Ordered Prior to Encounter[1]   Allergies[2]  History of present pregnancy: Pt Info/Preference:  Screening/Consents:  Labs:   EDD: Estimated Date of Delivery: 12/16/24  Establised: No LMP recorded. Patient is pregnant.  Anatomy Scan: Date: 21 weeks Placenta Location: posterior Genetic Screen: Panoroma:LR AFP:  First Tri: Quad:  Office: [facility]            First PNV: 13.1 weeks Blood Type --/--/O POS (12/10 1025)  Language: english Last PNV: 38.6 weeks Rhogam    Flu Vaccine:  declined   Antibody NEG (12/10 1025)  TDaP vaccine declined   GTT: Early: 5.4 Third Trimester: 105  Feeding Plan: br BTL: no Rubella: Immune (06/05 0000)  Contraception: ??? VBAC: no RPR: NON REACTIVE (12/10 1021)   Circumcision: ???   HBsAg: Negative (06/05 0000)  Pediatrician:  ???   HIV: Non-reactive (06/05 0000)   Prenatal Classes: no Additional US : GROWTH U/S-- CEPHALIC, AFI 17.13CM, NORMAL FLUID, FHR 131, EFW 5.8LBS 87%, CERVIX LENGTH 3.45CM, GBS: Negative/-- (01/18 0804)(For PCN allergy, check sensitivities)       Chlamydia: neg    MFM Referral/Consult:  GC: neg  Support Person: partner   PAP: ???  Pain Management: spinal Neonatologist Referral:  Hgb  Electrophoresis:  AA  Birth Plan: DCC   Hgb NOB: 11.8    28W: 10.5   OB History     Gravida  2   Para  1   Term  1   Preterm      AB      Living  1      SAB      IAB      Ectopic      Multiple  0   Live Births  1          Past Medical History:  Diagnosis Date   Heart murmur    Murmur    Murmur    Past Surgical History:  Procedure Laterality Date   CESAREAN SECTION N/A 01/17/2024   Procedure: CESAREAN SECTION;  Surgeon: Henry Slough, MD;  Location: [facility] LD ORS;  Service: Obstetrics;  Laterality: N/A;   CHOLECYSTECTOMY  11/11/2017   LAPAROSCOPIC CHOLECYSTECTOMY PEDIATRIC N/A 11/11/2017   Procedure: LAPAROSCOPIC  CHOLECYSTECTOMY PEDIATRIC;  Surgeon: Chuckie Casimiro KIDD, MD;  Location: [facility] OR;  Service: Pediatrics;  Laterality: N/A;   Family History: family history includes ADD / ADHD in an other family member; Gallstones in her mother; Hypertension in her paternal uncle; Hypothyroidism in her mother; Learning disabilities in her paternal uncle. Social History:  reports that she has never smoked. She uses smokeless tobacco. She reports that she does not drink alcohol and does not use drugs.   Prenatal Transfer Tool  Maternal Diabetes: No Genetic Screening: Normal Maternal Ultrasounds/Referrals: Normal Fetal Ultrasounds or other Referrals:  None Maternal Substance Abuse:  No Significant Maternal Medications:  None Significant Maternal Lab Results: Group B Strep negative  ROS:  Review of Systems  Constitutional: Negative.   HENT: Negative.    Eyes: Negative.   Respiratory: Negative.    Cardiovascular: Negative.   Gastrointestinal:  Positive for abdominal pain.  Genitourinary: Negative.   Musculoskeletal: Negative.   Skin: Negative.   Neurological: Negative.   Endo/Heme/Allergies: Negative.   Psychiatric/Behavioral: Negative.       Physical Exam: BP 112/65   Pulse 93   Temp 98.2 F (36.8 C) (Oral)   Resp 17   Ht 5' 1 (1.549 m)   Wt 71.3 kg   SpO2 99%   BMI 29.70 kg/m   Physical Exam Vitals and nursing note reviewed.  Constitutional:      Appearance: Normal appearance.  HENT:     Head: Normocephalic and atraumatic.     Nose: Nose normal.     Mouth/Throat:     Mouth: Mucous membranes are moist.  Eyes:     Conjunctiva/sclera: Conjunctivae normal.  Cardiovascular:     Rate and Rhythm: Normal rate and regular rhythm.     Pulses: Normal pulses.     Heart sounds: Normal heart sounds.  Pulmonary:     Effort: Pulmonary effort is normal.     Breath sounds: Normal breath sounds.  Abdominal:     General: Bowel sounds are normal.  Genitourinary:    General: Normal vulva.   Musculoskeletal:        General: Normal range of motion.     Cervical back: Normal range of motion and neck supple.  Skin:    General: Skin is warm.     Capillary Refill: Capillary refill takes less than 2 seconds.  Neurological:     General: No focal deficit present.     Mental Status: She is alert.  Psychiatric:        Mood and  Affect: Mood normal.      NST: FHR baseline 135 bpm, Variability: moderate, Accelerations:present, Decelerations:  Absent= Cat 1/Reactive UC:   irregular, 2 in 10 SVE:   Dilation: 4 Effacement (%): 80 Station: -3 Exam by:: N. Spellmon, RN, vertex verified by fetal sutures.  Leopold's: Position vertex, EFW 7.5lbs via leopold's.   Labs: No results found for this or any previous visit (from the past 24 hours).  Imaging:  No results found.  MAU Course: No orders of the defined types were placed in this encounter.  No orders of the defined types were placed in this encounter.   Assessment/Plan: Heather Hall is a 18 y.o. female, G2P1001, IUP at 38.6 weeks, presenting for RCS due to latent labor. Last ate at 1400 on 12/11, plans to admit to OBS and continue planned RCS for 12/12. H/O Anemia hgb 10.8 at NOB on PO Iron . Hydronephrosis moderate right sided was referred to urology. Vit D def. H/O PPH with 1 CS. Pt endorse + Fm. Denies vaginal leakage. Denies vaginal bleeding.    FWB: Cat 1 Fetal Tracing.   Plan: Admit to OBS with consult with Dr Armond Plan for RCS 12/11 with DR Henry. PIV with fluid over night, and IV fentanyl  PRN for pain.  SCD Foley Cbc/T&S Clip Site IV Consent Ancef  and Azith NPO  Shavonn Convey  CNM, FNP-C, PMHNP-BC  [address]  [place], [place] [address]  Cell: [phone]  Office Phone: [phone] Fax: [phone] 12/08/2024  3:22 PM         [1]  No current facility-administered medications on file prior to encounter.   Current Outpatient Medications on File Prior to Encounter  Medication Sig  Dispense Refill   acetaminophen  (TYLENOL ) 500 MG tablet Take 2 tablets (1,000 mg total) by mouth every 6 (six) hours. (Patient taking differently: Take 1,000 mg by mouth every 6 (six) hours as needed for mild pain (pain score 1-3) or moderate pain (pain score 4-6).) 30 tablet 0   FEROSUL 325 (65 Fe) MG tablet Take 325 mg by mouth daily.     Prenatal Vit-Fe Fumarate-FA (PRENATAL MULTIVITAMIN) TABS tablet Take 1 tablet by mouth daily at 12 noon. 30 tablet 6  [2] No Known Allergies

## 2024-12-09 ENCOUNTER — Inpatient Hospital Stay (HOSPITAL_COMMUNITY): Admitting: Anesthesiology

## 2024-12-09 ENCOUNTER — Encounter (HOSPITAL_COMMUNITY): Payer: Self-pay | Admitting: Obstetrics and Gynecology

## 2024-12-09 ENCOUNTER — Encounter (HOSPITAL_COMMUNITY): Admission: AD | Disposition: A | Payer: Self-pay | Source: Home / Self Care | Attending: Obstetrics and Gynecology

## 2024-12-09 ENCOUNTER — Inpatient Hospital Stay (HOSPITAL_COMMUNITY): Admission: RE | Admit: 2024-12-09 | Source: Home / Self Care | Admitting: Obstetrics and Gynecology

## 2024-12-09 DIAGNOSIS — Z3A39 39 weeks gestation of pregnancy: Secondary | ICD-10-CM

## 2024-12-09 DIAGNOSIS — Z98891 History of uterine scar from previous surgery: Secondary | ICD-10-CM

## 2024-12-09 DIAGNOSIS — O34219 Maternal care for unspecified type scar from previous cesarean delivery: Secondary | ICD-10-CM

## 2024-12-09 LAB — SYPHILIS: RPR W/REFLEX TO RPR TITER AND TREPONEMAL ANTIBODIES, TRADITIONAL SCREENING AND DIAGNOSIS ALGORITHM: RPR Ser Ql: NONREACTIVE

## 2024-12-09 SURGERY — Surgical Case
Anesthesia: Spinal

## 2024-12-09 MED ORDER — SOD CITRATE-CITRIC ACID 500-334 MG/5ML PO SOLN
30.0000 mL | ORAL | Status: AC
  Administered 2024-12-09: 30 mL via ORAL
  Filled 2024-12-09: qty 30

## 2024-12-09 MED ORDER — MORPHINE SULFATE (PF) 0.5 MG/ML IJ SOLN
INTRAMUSCULAR | Status: AC
  Filled 2024-12-09: qty 10

## 2024-12-09 MED ORDER — SIMETHICONE 80 MG PO CHEW: 80.0000 mg | CHEWABLE_TABLET | ORAL | Status: DC | PRN

## 2024-12-09 MED ORDER — COCONUT OIL OIL: 1.0000 | TOPICAL_OIL | Status: DC | PRN

## 2024-12-09 MED ORDER — KETOROLAC TROMETHAMINE 30 MG/ML IJ SOLN
30.0000 mg | Freq: Four times a day (QID) | INTRAMUSCULAR | Status: AC
  Administered 2024-12-09 – 2024-12-10 (×3): 30 mg via INTRAVENOUS
  Filled 2024-12-09 (×3): qty 1

## 2024-12-09 MED ORDER — SODIUM CHLORIDE 0.9% FLUSH: 3.0000 mL | INTRAVENOUS | Status: DC | PRN

## 2024-12-09 MED ORDER — SCOPOLAMINE 1 MG/3DAYS TD PT72
MEDICATED_PATCH | TRANSDERMAL | Status: AC
  Filled 2024-12-09: qty 1

## 2024-12-09 MED ORDER — SENNOSIDES-DOCUSATE SODIUM 8.6-50 MG PO TABS
2.0000 | ORAL_TABLET | Freq: Every day | ORAL | Status: DC
  Administered 2024-12-10 – 2024-12-11 (×2): 2 via ORAL
  Filled 2024-12-09 (×2): qty 2

## 2024-12-09 MED ORDER — ACETAMINOPHEN 10 MG/ML IV SOLN
INTRAVENOUS | Status: DC | PRN
  Administered 2024-12-09: 1000 mg via INTRAVENOUS

## 2024-12-09 MED ORDER — DIPHENHYDRAMINE HCL 25 MG PO CAPS: 25.0000 mg | ORAL_CAPSULE | ORAL | Status: DC | PRN

## 2024-12-09 MED ORDER — KETOROLAC TROMETHAMINE 30 MG/ML IJ SOLN
30.0000 mg | Freq: Once | INTRAMUSCULAR | Status: AC | PRN
  Administered 2024-12-09: 30 mg via INTRAVENOUS

## 2024-12-09 MED ORDER — SOD CITRATE-CITRIC ACID 500-334 MG/5ML PO SOLN
ORAL | Status: AC
  Filled 2024-12-09: qty 30

## 2024-12-09 MED ORDER — ONDANSETRON HCL 4 MG/2ML IJ SOLN: 4.0000 mg | Freq: Three times a day (TID) | INTRAMUSCULAR | Status: DC | PRN

## 2024-12-09 MED ORDER — FENTANYL CITRATE (PF) 100 MCG/2ML IJ SOLN
50.0000 ug | Freq: Once | INTRAMUSCULAR | Status: AC
  Administered 2024-12-09: 50 ug via INTRAVENOUS
  Filled 2024-12-09: qty 2

## 2024-12-09 MED ORDER — STERILE WATER FOR IRRIGATION IR SOLN
Status: DC | PRN
  Administered 2024-12-09: 1000 mL

## 2024-12-09 MED ORDER — METOCLOPRAMIDE HCL 5 MG/ML IJ SOLN
INTRAMUSCULAR | Status: DC | PRN
  Administered 2024-12-09 (×2): 5 mg via INTRAVENOUS

## 2024-12-09 MED ORDER — OXYCODONE HCL 5 MG PO TABS: 5.0000 mg | ORAL_TABLET | Freq: Once | ORAL | Status: DC | PRN

## 2024-12-09 MED ORDER — IBUPROFEN 600 MG PO TABS
600.0000 mg | ORAL_TABLET | Freq: Four times a day (QID) | ORAL | Status: DC
  Administered 2024-12-10 – 2024-12-11 (×4): 600 mg via ORAL
  Filled 2024-12-09 (×4): qty 1

## 2024-12-09 MED ORDER — ONDANSETRON HCL 4 MG/2ML IJ SOLN
INTRAMUSCULAR | Status: DC | PRN
  Administered 2024-12-09: 4 mg via INTRAVENOUS

## 2024-12-09 MED ORDER — SODIUM CHLORIDE (PF) 0.9 % IJ SOLN
INTRAMUSCULAR | Status: AC
  Filled 2024-12-09: qty 10

## 2024-12-09 MED ORDER — KETOROLAC TROMETHAMINE 30 MG/ML IJ SOLN: 30.0000 mg | Freq: Four times a day (QID) | INTRAMUSCULAR | Status: DC | PRN

## 2024-12-09 MED ORDER — DEXAMETHASONE SOD PHOSPHATE PF 10 MG/ML IJ SOLN
INTRAMUSCULAR | Status: DC | PRN
  Administered 2024-12-09: 10 mg via INTRAVENOUS

## 2024-12-09 MED ORDER — FENTANYL CITRATE (PF) 100 MCG/2ML IJ SOLN
INTRAMUSCULAR | Status: AC
  Filled 2024-12-09: qty 2

## 2024-12-09 MED ORDER — TRIAMCINOLONE ACETONIDE 40 MG/ML IJ SUSP
INTRAMUSCULAR | Status: AC
  Filled 2024-12-09: qty 1

## 2024-12-09 MED ORDER — TRANEXAMIC ACID-NACL 1000-0.7 MG/100ML-% IV SOLN
INTRAVENOUS | Status: DC | PRN
  Administered 2024-12-09 (×2): 1000 mg via INTRAVENOUS

## 2024-12-09 MED ORDER — DIPHENHYDRAMINE HCL 25 MG PO CAPS: 25.0000 mg | ORAL_CAPSULE | Freq: Four times a day (QID) | ORAL | Status: DC | PRN

## 2024-12-09 MED ORDER — DIBUCAINE (PERIANAL) 1 % EX OINT: 1.0000 | TOPICAL_OINTMENT | CUTANEOUS | Status: DC | PRN

## 2024-12-09 MED ORDER — ACETAMINOPHEN 500 MG PO TABS
1000.0000 mg | ORAL_TABLET | Freq: Four times a day (QID) | ORAL | Status: DC
  Administered 2024-12-09 – 2024-12-11 (×7): 1000 mg via ORAL
  Filled 2024-12-09 (×7): qty 2

## 2024-12-09 MED ORDER — SCOPOLAMINE 1 MG/3DAYS TD PT72
1.0000 | MEDICATED_PATCH | Freq: Once | TRANSDERMAL | Status: DC
  Administered 2024-12-09: 1 mg via TRANSDERMAL

## 2024-12-09 MED ORDER — ACETAMINOPHEN 500 MG PO TABS: 1000.0000 mg | ORAL_TABLET | Freq: Four times a day (QID) | ORAL | Status: DC

## 2024-12-09 MED ORDER — CEFAZOLIN SODIUM-DEXTROSE 2-4 GM/100ML-% IV SOLN
2.0000 g | INTRAVENOUS | Status: AC
  Administered 2024-12-09: 2 g via INTRAVENOUS
  Filled 2024-12-09: qty 100

## 2024-12-09 MED ORDER — SIMETHICONE 80 MG PO CHEW
80.0000 mg | CHEWABLE_TABLET | Freq: Three times a day (TID) | ORAL | Status: DC
  Administered 2024-12-10 – 2024-12-11 (×4): 80 mg via ORAL
  Filled 2024-12-09 (×4): qty 1

## 2024-12-09 MED ORDER — SODIUM CHLORIDE 0.9% FLUSH
INTRAVENOUS | Status: DC | PRN
  Administered 2024-12-09: 10 mL via INTRAVENOUS

## 2024-12-09 MED ORDER — TERBUTALINE SULFATE 1 MG/ML IJ SOLN
0.2500 mg | Freq: Once | INTRAMUSCULAR | Status: AC
  Administered 2024-12-09: 0.25 mg via SUBCUTANEOUS

## 2024-12-09 MED ORDER — ONDANSETRON HCL 4 MG/2ML IJ SOLN
INTRAMUSCULAR | Status: AC
  Filled 2024-12-09: qty 2

## 2024-12-09 MED ORDER — FENTANYL CITRATE (PF) 100 MCG/2ML IJ SOLN: 25.0000 ug | INTRAMUSCULAR | Status: DC | PRN

## 2024-12-09 MED ORDER — MENTHOL 3 MG MT LOZG: 1.0000 | LOZENGE | OROMUCOSAL | Status: DC | PRN

## 2024-12-09 MED ORDER — DIPHENHYDRAMINE HCL 50 MG/ML IJ SOLN: 12.5000 mg | INTRAMUSCULAR | Status: DC | PRN

## 2024-12-09 MED ORDER — PHENYLEPHRINE HCL-NACL 20-0.9 MG/250ML-% IV SOLN
INTRAVENOUS | Status: AC
  Filled 2024-12-09: qty 250

## 2024-12-09 MED ORDER — SODIUM CHLORIDE 0.9 % IV SOLN
INTRAVENOUS | Status: DC | PRN
  Administered 2024-12-09: 500 mg via INTRAVENOUS

## 2024-12-09 MED ORDER — FENTANYL CITRATE (PF) 100 MCG/2ML IJ SOLN: 50.0000 ug | INTRAMUSCULAR | Status: DC | PRN

## 2024-12-09 MED ORDER — OXYCODONE HCL 5 MG PO TABS
5.0000 mg | ORAL_TABLET | ORAL | Status: DC | PRN
  Administered 2024-12-10: 5 mg via ORAL
  Administered 2024-12-10: 10 mg via ORAL
  Administered 2024-12-11: 5 mg via ORAL
  Filled 2024-12-09: qty 1
  Filled 2024-12-09 (×2): qty 2

## 2024-12-09 MED ORDER — NALOXONE HCL 4 MG/10ML IJ SOLN: 1.0000 ug/kg/h | INTRAVENOUS | Status: DC | PRN

## 2024-12-09 MED ORDER — SODIUM CHLORIDE 0.9 % IV SOLN
25.0000 mg | Freq: Four times a day (QID) | INTRAVENOUS | Status: DC | PRN
  Administered 2024-12-09: 25 mg via INTRAVENOUS
  Filled 2024-12-09 (×2): qty 1

## 2024-12-09 MED ORDER — OXYTOCIN-SODIUM CHLORIDE 30-0.9 UT/500ML-% IV SOLN
INTRAVENOUS | Status: DC | PRN
  Administered 2024-12-09: 300 mL via INTRAVENOUS

## 2024-12-09 MED ORDER — FENTANYL CITRATE (PF) 100 MCG/2ML IJ SOLN
INTRAMUSCULAR | Status: DC | PRN
  Administered 2024-12-09: 15 ug via INTRATHECAL

## 2024-12-09 MED ORDER — PRENATAL MULTIVITAMIN CH
1.0000 | ORAL_TABLET | Freq: Every day | ORAL | Status: DC
  Administered 2024-12-10 – 2024-12-11 (×2): 1 via ORAL
  Filled 2024-12-09 (×2): qty 1

## 2024-12-09 MED ORDER — WITCH HAZEL-GLYCERIN EX PADS: 1.0000 | MEDICATED_PAD | CUTANEOUS | Status: DC | PRN

## 2024-12-09 MED ORDER — SODIUM CHLORIDE 0.9 % IR SOLN
Status: DC | PRN
  Administered 2024-12-09: 1

## 2024-12-09 MED ORDER — KETOROLAC TROMETHAMINE 30 MG/ML IJ SOLN
INTRAMUSCULAR | Status: AC
  Filled 2024-12-09: qty 1

## 2024-12-09 MED ORDER — TRIAMCINOLONE ACETONIDE 40 MG/ML IJ SUSP
INTRAMUSCULAR | Status: DC | PRN
  Administered 2024-12-09: 40 mg via INTRAMUSCULAR

## 2024-12-09 MED ORDER — TERBUTALINE SULFATE 1 MG/ML IJ SOLN
INTRAMUSCULAR | Status: AC
  Filled 2024-12-09: qty 1

## 2024-12-09 MED ORDER — ZOLPIDEM TARTRATE 5 MG PO TABS: 5.0000 mg | ORAL_TABLET | Freq: Every evening | ORAL | Status: DC | PRN

## 2024-12-09 MED ORDER — NALOXONE HCL 0.4 MG/ML IJ SOLN: 0.4000 mg | INTRAMUSCULAR | Status: DC | PRN

## 2024-12-09 MED ORDER — OXYCODONE HCL 5 MG/5ML PO SOLN: 5.0000 mg | Freq: Once | ORAL | Status: DC | PRN

## 2024-12-09 MED ORDER — OXYTOCIN-SODIUM CHLORIDE 30-0.9 UT/500ML-% IV SOLN: 2.5000 [IU]/h | INTRAVENOUS | Status: AC

## 2024-12-09 MED ORDER — MORPHINE SULFATE (PF) 0.5 MG/ML IJ SOLN
INTRAMUSCULAR | Status: DC | PRN
  Administered 2024-12-09: 150 ug via INTRATHECAL

## 2024-12-09 MED ORDER — PHENYLEPHRINE HCL-NACL 20-0.9 MG/250ML-% IV SOLN
INTRAVENOUS | Status: DC | PRN
  Administered 2024-12-09: 60 ug/min via INTRAVENOUS

## 2024-12-09 MED FILL — Azithromycin IV For Soln 500 MG: INTRAVENOUS | Qty: 5 | Status: AC

## 2024-12-09 SURGICAL SUPPLY — 36 items
BENZOIN TINCTURE PRP APPL 2/3 (GAUZE/BANDAGES/DRESSINGS) ×1 IMPLANT
CHLORAPREP W/TINT 26 (MISCELLANEOUS) ×2 IMPLANT
CLAMP UMBILICAL CORD (MISCELLANEOUS) ×1 IMPLANT
CLOTH BEACON ORANGE TIMEOUT ST (SAFETY) ×1 IMPLANT
CLSR STERI-STRIP ANTIMIC 1/2X4 (GAUZE/BANDAGES/DRESSINGS) IMPLANT
DERMABOND ADVANCED .7 DNX12 (GAUZE/BANDAGES/DRESSINGS) IMPLANT
DRSG OPSITE POSTOP 4X10 (GAUZE/BANDAGES/DRESSINGS) ×1 IMPLANT
ELECTRODE REM PT RTRN 9FT ADLT (ELECTROSURGICAL) ×1 IMPLANT
EXTRACTOR VACUUM M CUP 4 TUBE (SUCTIONS) IMPLANT
GAUZE SPONGE 4X4 12PLY STRL (GAUZE/BANDAGES/DRESSINGS) IMPLANT
GLOVE BIO SURGEON STRL SZ7.5 (GLOVE) ×1 IMPLANT
GLOVE BIOGEL PI IND STRL 7.0 (GLOVE) ×1 IMPLANT
GLOVE BIOGEL PI IND STRL 7.5 (GLOVE) ×1 IMPLANT
GOWN STRL REUS W/TWL LRG LVL3 (GOWN DISPOSABLE) ×2 IMPLANT
KIT ABG SYR 3ML LUER SLIP (SYRINGE) IMPLANT
MAT PREVALON FULL STRYKER (MISCELLANEOUS) IMPLANT
NDL HYPO 25X5/8 SAFETYGLIDE (NEEDLE) IMPLANT
NEEDLE HYPO 25X5/8 SAFETYGLIDE (NEEDLE) IMPLANT
NS IRRIG 1000ML POUR BTL (IV SOLUTION) ×1 IMPLANT
PACK C SECTION WH (CUSTOM PROCEDURE TRAY) ×1 IMPLANT
PAD ABD 8X10 STRL (GAUZE/BANDAGES/DRESSINGS) IMPLANT
PAD OB MATERNITY 4.3X12.25 (PERSONAL CARE ITEMS) ×1 IMPLANT
RETAINER VISCERAL (MISCELLANEOUS) IMPLANT
RTRCTR C-SECT PINK 25CM LRG (MISCELLANEOUS) ×1 IMPLANT
STRIP CLOSURE SKIN 1/2X4 (GAUZE/BANDAGES/DRESSINGS) ×1 IMPLANT
SUT CHROMIC 2 0 CT 1 (SUTURE) ×1 IMPLANT
SUT MNCRL 0 VIOLET CTX 36 (SUTURE) ×1 IMPLANT
SUT MNCRL AB 3-0 PS2 27 (SUTURE) ×1 IMPLANT
SUT VIC AB 0 CT1 36 (SUTURE) ×1 IMPLANT
SUT VIC AB 0 CTX36XBRD ANBCTRL (SUTURE) ×3 IMPLANT
SUT VIC AB 2-0 SH 27XBRD (SUTURE) IMPLANT
SUTURE PLAIN GUT 2.0 ETHICON (SUTURE) ×1 IMPLANT
TAPE CLOTH SURG 4X10 WHT LF (GAUZE/BANDAGES/DRESSINGS) IMPLANT
TOWEL OR 17X24 6PK STRL BLUE (TOWEL DISPOSABLE) ×1 IMPLANT
TRAY FOLEY W/BAG SLVR 14FR LF (SET/KITS/TRAYS/PACK) ×1 IMPLANT
WATER STERILE IRR 1000ML POUR (IV SOLUTION) ×1 IMPLANT

## 2024-12-09 NOTE — Op Note (Addendum)
 Cesarean Section Procedure Note  Indications: 18yo G2P1001 at 39wks presenting for repeat c-section.  Pre-operative Diagnosis: 1.39wks 2.History of cesarean section, low transverse [   Post-operative Diagnosis: 1.39wks 2.History of cesarean section, low transverse [  Procedure: Repeat Low Transverse Cesarean Section  Surgeon: Henry Slough, MD    Assistants: Rocky PILA OR Charge RN  Anesthesia: Regional  Anesthesiologist: No responsible provider has been recorded for the case.   Procedure Details  The patient was taken to the operating room secondary to h/o c-section after the risks, benefits, complications, treatment options, and expected outcomes were discussed with the patient.  The patient concurred with the proposed plan, giving informed consent which was signed and witnessed. The patient was taken to Operating Room C, identified as Heather Hall and the procedure verified as C-Section Delivery. A Time Out was held and the above information confirmed.  After induction of anesthesia by obtaining a spinal, the patient was prepped and draped in the usual sterile manner. A Pfannenstiel skin incision was made and carried down through the subcutaneous tissue to the underlying layer of fascia.  The fascia was incised bilaterally and extended transversely bilaterally with the Mayo scissors. Kocher clamps were placed on the inferior aspect of the fascial incision and the underlying rectus muscle was separated from the fascia. The same was done on the superior aspect of the fascial incision.  The peritoneum was identified, entered bluntly and extended manually.  An Alexis self-retaining retractor was placed.  The utero-vesical peritoneal reflection was incised transversely and the bladder flap was bluntly freed from the lower uterine segment. A low transverse uterine incision was made with the scalpel and extended bilaterally with the bandage scissors.  The infant was delivered in vertex position  without difficulty.  After the umbilical cord was clamped and cut, the infant was handed to the awaiting pediatricians.  Cord blood was obtained for evaluation.  The placenta was removed intact and appeared to be within normal limits. The uterus was cleared of all clots and debris. The uterine incision was closed with running interlocking sutures of 0 Vicryl and a second imbricating layer was performed as well.   Bilateral tubes and ovaries appeared to be within normal limits.  Good hemostasis was noted.  Copious irrigation was performed until clear.  The peritoneum was repaired with 2-0 chromic via a running suture.  The fascia was reapproximated with a running suture of 0 Vicryl. The subcutaneous tissue was reapproximated with 3 interrupted sutures of 2-0 plain.  The skin was reapproximated with a subcuticular suture of 3-0 monocryl.  Steristrips were applied.  Instrument, sponge, and needle counts were correct prior to abdominal closure and at the conclusion of the case.  The patient was awaiting transfer to the recovery room in good condition.  Findings: Live female infant with Apgars 8 at one minute and 9 at five minutes.  Normal appearing bilateral ovaries and fallopian tubes were noted.  Estimated Blood Loss:          Drains: foley to gravity 300 cc clear urine         Total IV Fluids: 2500 ml         Specimens to Pathology: None         Complications:  None; patient tolerated the procedure well.         Disposition: PACU - hemodynamically stable.         Condition: stable  Attending Attestation: I performed the procedure.  I was present  and scrubbed and the assistant was required due to complexity of anatomy.

## 2024-12-09 NOTE — Transfer of Care (Signed)
 Immediate Anesthesia Transfer of Care Note  Patient: Heather Hall  Procedure(s) Performed: CESAREAN DELIVERY  Patient Location: PACU  Anesthesia Type:Spinal  Level of Consciousness: awake, alert , and oriented  Airway & Oxygen Therapy: Patient Spontanous Breathing  Post-op Assessment: Report given to RN and Post -op Vital signs reviewed and stable  Post vital signs: Reviewed and stable  Last Vitals:  Vitals Value Taken Time  BP 104/48 12/09/24 15:45  Temp    Pulse 83 12/09/24 15:50  Resp 20 12/09/24 15:50  SpO2 100 % 12/09/24 15:50  Vitals shown include unfiled device data.  Last Pain:  Vitals:   12/09/24 1231  TempSrc: Oral  PainSc: 0-No pain         Complications: No notable events documented.

## 2024-12-09 NOTE — Anesthesia Preprocedure Evaluation (Signed)
 Anesthesia Evaluation  Patient identified by MRN, date of birth, ID band Patient awake    Reviewed: Allergy & Precautions, NPO status , Patient's Chart, lab work & pertinent test results  Airway Mallampati: II  TM Distance: >3 FB Neck ROM: Full    Dental no notable dental hx.    Pulmonary neg pulmonary ROS   Pulmonary exam normal breath sounds clear to auscultation       Cardiovascular negative cardio ROS Normal cardiovascular exam Rhythm:Regular Rate:Normal     Neuro/Psych negative neurological ROS  negative psych ROS   GI/Hepatic negative GI ROS, Neg liver ROS,,,  Endo/Other  negative endocrine ROS    Renal/GU negative Renal ROS  negative genitourinary   Musculoskeletal  (+) Arthritis ,    Abdominal   Peds  Hematology negative hematology ROS (+)   Anesthesia Other Findings Repeat C/S  Reproductive/Obstetrics (+) Pregnancy                              Anesthesia Physical Anesthesia Plan  ASA: 2  Anesthesia Plan: Spinal   Post-op Pain Management:    Induction:   PONV Risk Score and Plan: Treatment may vary due to age or medical condition  Airway Management Planned: Natural Airway  Additional Equipment:   Intra-op Plan:   Post-operative Plan:   Informed Consent: I have reviewed the patients History and Physical, chart, labs and discussed the procedure including the risks, benefits and alternatives for the proposed anesthesia with the patient or authorized representative who has indicated his/her understanding and acceptance.     Dental advisory given  Plan Discussed with: CRNA  Anesthesia Plan Comments:         Anesthesia Quick Evaluation

## 2024-12-09 NOTE — Progress Notes (Signed)
 Tawny M Timoney is a 18 y.o. G2P1001 at [redacted]w[redacted]d admitted for repeat c-section.  Subjective: C/o painful contractions.  No LOF or VB.  + FM.  Anticipating c-section.  Objective: BP 111/70   Pulse 72   Temp 98.2 F (36.8 C) (Oral)   Resp 18   Ht 5' 1 (1.549 m)   Wt 71.3 kg   SpO2 99%   BMI 29.70 kg/m  No intake/output data recorded. No intake/output data recorded.  FHT:  FHR: 140 bpm, variability: moderate,  accelerations:  Present,  decelerations:  Absent UC:   irregular, every 6-10 minutes SVE:   Dilation: 5 Effacement (%): 90 Station: -3 Exam by:: Vertell Marc, RN  Labs: Lab Results  Component Value Date   WBC 11.3 (H) 12/08/2024   HGB 12.7 12/08/2024   HCT 41.3 12/08/2024   MCV 79.9 (L) 12/08/2024   PLT 245 12/08/2024    Assessment / Plan: 18yo G2P1001 at 39wks admitted for observation d/t scheduled c-section for today d/t h/o cesarean section.  Labor: prodromal labor Preeclampsia:  no signs or symptoms of toxicity Fetal Wellbeing:  Category I Pain Control:  IV pain meds I/D:  GBS negative Anticipated MOD:  Repeat LTCS.  Risks benefits and alternatives reviewed including but not limited to bleeding infection and injury, questions answered and consent signed and witnessed.  Jon CINDERELLA Rummer, MD 12/09/2024, 12:20 PM

## 2024-12-09 NOTE — Lactation Note (Signed)
 This note was copied from a baby's chart. Lactation Consultation Note  Patient Name: Heather Hall Date: 12/09/2024 Age:20 hours Reason for consult: Initial assessment;Term.  P2, Per MOB, breastfeeding is going well, infant latched on MOB, right breast using the football hold position with pillow support, infant latched with depth and sustained her latch, was still breastfeeding when LC left the room.MOB will continue to breastfeed infant by cues, on demand, 8-12 times within 24 hours, skin to skin. Infant had void and stool diaper while LC was in the room. MOB knows to call for latch assistance if needed. LC discussed the importance of maternal rest, meals and hydration. MOB was  made aware of O/P services, breastfeeding support groups, community resources, and our phone # for post-discharge questions.    Maternal Data Has patient been taught Hand Expression?: Yes Does the patient have breastfeeding experience prior to this delivery?: Yes How long did the patient breastfeed?: Per MOB, she breastfeed her first child for 6 weeks  Feeding Mother's Current Feeding Choice: Breast Milk  LATCH Score Latch: Grasps breast easily, tongue down, lips flanged, rhythmical sucking.  Audible Swallowing: A few with stimulation  Type of Nipple: Everted at rest and after stimulation  Comfort (Breast/Nipple): Soft / non-tender  Hold (Positioning): Assistance needed to correctly position infant at breast and maintain latch.  LATCH Score: 8   Lactation Tools Discussed/Used    Interventions Interventions: Breast feeding basics reviewed;Assisted with latch;Skin to skin;Breast compression;Adjust position;Support pillows;Position options;Expressed milk;DEBP;Education;CDC milk storage guidelines;CDC Guidelines for Breast Pump Cleaning;LC Services brochure;Guidelines for Milk Supply and Pumping Schedule Handout  Discharge Pump: DEBP;Personal  Consult Status Consult Status: Follow-up Date:  12/10/24 Follow-up type: In-patient    Heather Hall 12/09/2024, 6:43 PM

## 2024-12-10 LAB — CBC
HCT: 29 % — ABNORMAL LOW (ref 36.0–46.0)
Hemoglobin: 9.3 g/dL — ABNORMAL LOW (ref 12.0–15.0)
MCH: 24.7 pg — ABNORMAL LOW (ref 26.0–34.0)
MCHC: 32.1 g/dL (ref 30.0–36.0)
MCV: 77.1 fL — ABNORMAL LOW (ref 80.0–100.0)
Platelets: 202 K/uL (ref 150–400)
RBC: 3.76 MIL/uL — ABNORMAL LOW (ref 3.87–5.11)
RDW: 16.6 % — ABNORMAL HIGH (ref 11.5–15.5)
WBC: 15.8 K/uL — ABNORMAL HIGH (ref 4.0–10.5)
nRBC: 0 % (ref 0.0–0.2)

## 2024-12-10 NOTE — Progress Notes (Signed)
 Subjective: POD# 1 Information for the patient's newborn:  Heather Hall, Heather Hall [[id number]]  female  Baby's Name ***  Reports feeling *** Feeding: {feeding:120017} Reports tolerating PO and denies N/V, foley removed, ambulating and urinating w/o difficulty  Pain controlled with {treatments; pain control med:13496} Denies HA/SOB/dizziness  Flatus *** Vaginal bleeding is normal, *** clots     Objective:  VS:  Vitals:   12/10/24 0516 12/10/24 1045 12/10/24 1438 12/10/24 1942  BP: (!) 88/55 (!) 95/53 101/61 (!) 98/55  Pulse: 60 69 69 72  Resp: 16 18 18 18   Temp: 98.4 F (36.9 C) 97.6 F (36.4 C) 97.8 F (36.6 C) 98.5 F (36.9 C)  TempSrc: Oral Axillary Axillary Oral  SpO2:  97% 98% 99%  Weight:      Height:        Intake/Output Summary (Last 24 hours) at 12/10/2024 2213 Last data filed at 12/10/2024 0516 Gross per 24 hour  Intake --  Output 450 ml  Net -450 ml     Recent Labs    12/08/24 1633 12/10/24 0536  WBC 11.3* 15.8*  HGB 12.7 9.3*  HCT 41.3 29.0*  PLT 245 202    Blood type: --/--/O POS (12/10 1025) Rubella: Immune (06/05 0000)    Physical Exam:  General: {Exam; general:16600} CV: {Exam; heart brief:31539} Resp: {Exam; lungs brief:12271} Abdomen: {Exam; abdomen brief:12273} Incision: {incision:3041137} Perineum:  Uterine Fundus: firm, below umbilicus, {Desc; tender/non:10087} Lochia: {exam; vaginal bleeding:3041122} Ext: *** edema, negative for tenderness, pain, and cords   Assessment/Plan: 18 y.o.   POD# {NUMBERS 1-5:20334}. [id number]                  Principal Problem:   H/O cesarean section Active Problems:   History of cesarean section   Routine post-op PP care          Advance diet as tolerated Advised warm fluids and ambulation to improve GI motility Lactation support PRN Contraception: *** Anticipate D/C ***

## 2024-12-10 NOTE — Lactation Note (Addendum)
 This note was copied from a baby's chart. Lactation Consultation Note  Patient Name: Heather Hall Unijb'd Date: 12/10/2024 Age:20 hours Reason for consult: Follow-up assessment;Term  P2- MOB reports that infant started cluster feeding and has wanted to stay latched to the breast. MOB reports that infant latches well, but her nipples are starting to become a little sore. MOB reports using nipple cream to help. MOB denies having any questions or concerns at this time. LC reviewed engorgement/breast care with MOB due to her hx of engorgement from her last child in Jan 2025. LC encouraged MOB to call for further assistance as needed.  Maternal Data Has patient been taught Hand Expression?: No Does the patient have breastfeeding experience prior to this delivery?: Yes How long did the patient breastfeed?: 6 weeks  Feeding Mother's Current Feeding Choice: Breast Milk  Lactation Tools Discussed/Used Pump Education: Milk Storage  Interventions Interventions: Breast feeding basics reviewed;Education;LC Services brochure  Discharge Discharge Education: Engorgement and breast care;Warning signs for feeding baby Pump: DEBP;Personal  Consult Status Consult Status: Follow-up Date: 12/11/24 Follow-up type: In-patient    Recardo Hoit BS, IBCLC 12/10/2024, 10:12 PM

## 2024-12-11 ENCOUNTER — Encounter (HOSPITAL_COMMUNITY): Payer: Self-pay | Admitting: Obstetrics and Gynecology

## 2024-12-11 MED ORDER — OXYCODONE HCL 5 MG PO TABS: 5.0000 mg | ORAL_TABLET | Freq: Four times a day (QID) | ORAL | 0 refills | Status: AC | PRN

## 2024-12-11 MED ORDER — IBUPROFEN 600 MG PO TABS: 600.0000 mg | ORAL_TABLET | Freq: Four times a day (QID) | ORAL | 0 refills | Status: AC

## 2024-12-11 MED ORDER — ACETAMINOPHEN 500 MG PO TABS: 1000.0000 mg | ORAL_TABLET | Freq: Four times a day (QID) | ORAL | Status: AC

## 2024-12-11 NOTE — Plan of Care (Signed)
  Problem: Education: Goal: Knowledge of disease or condition will improve Outcome: Completed/Met Goal: Knowledge of the prescribed therapeutic regimen will improve Outcome: Completed/Met Goal: Individualized Educational Video(s) Outcome: Completed/Met   Problem: Clinical Measurements: Goal: Complications related to the disease process, condition or treatment will be avoided or minimized Outcome: Completed/Met   Problem: Education: Goal: Knowledge of General Education information will improve Description: Including pain rating scale, medication(s)/side effects and non-pharmacologic comfort measures Outcome: Completed/Met   Problem: Health Behavior/Discharge Planning: Goal: Ability to manage health-related needs will improve Outcome: Completed/Met   Problem: Clinical Measurements: Goal: Ability to maintain clinical measurements within normal limits will improve Outcome: Completed/Met Goal: Will remain free from infection Outcome: Completed/Met Goal: Diagnostic test results will improve Outcome: Completed/Met Goal: Respiratory complications will improve Outcome: Completed/Met Goal: Cardiovascular complication will be avoided Outcome: Completed/Met   Problem: Activity: Goal: Risk for activity intolerance will decrease Outcome: Completed/Met   Problem: Nutrition: Goal: Adequate nutrition will be maintained Outcome: Completed/Met   Problem: Coping: Goal: Level of anxiety will decrease Outcome: Completed/Met   Problem: Elimination: Goal: Will not experience complications related to bowel motility Outcome: Completed/Met Goal: Will not experience complications related to urinary retention Outcome: Completed/Met   Problem: Pain Managment: Goal: General experience of comfort will improve and/or be controlled Outcome: Completed/Met   Problem: Safety: Goal: Ability to remain free from injury will improve Outcome: Completed/Met   Problem: Skin Integrity: Goal: Risk for  impaired skin integrity will decrease Outcome: Completed/Met   Problem: Education: Goal: Knowledge of the prescribed therapeutic regimen will improve Outcome: Completed/Met Goal: Understanding of sexual limitations or changes related to disease process or condition will improve Outcome: Completed/Met Goal: Individualized Educational Video(s) Outcome: Completed/Met   Problem: Self-Concept: Goal: Communication of feelings regarding changes in body function or appearance will improve Outcome: Completed/Met   Problem: Skin Integrity: Goal: Demonstration of wound healing without infection will improve Outcome: Completed/Met   Problem: Education: Goal: Knowledge of condition will improve Outcome: Completed/Met Goal: Individualized Educational Video(s) Outcome: Completed/Met Goal: Individualized Newborn Educational Video(s) Outcome: Completed/Met   Problem: Activity: Goal: Will verbalize the importance of balancing activity with adequate rest periods Outcome: Completed/Met Goal: Ability to tolerate increased activity will improve Outcome: Completed/Met   Problem: Coping: Goal: Ability to identify and utilize available resources and services will improve Outcome: Completed/Met   Problem: Life Cycle: Goal: Chance of risk for complications during the postpartum period will decrease Outcome: Completed/Met   Problem: Role Relationship: Goal: Ability to demonstrate positive interaction with newborn will improve Outcome: Completed/Met   Problem: Skin Integrity: Goal: Demonstration of wound healing without infection will improve Outcome: Completed/Met

## 2024-12-11 NOTE — Discharge Summary (Signed)
 Postpartum Discharge Summary  Date of Service updated 12/11/2024     Patient Name: Heather Hall DOB: [birth year] MRN: [id number]  Date of admission: 12/08/2024 Delivery date:12/09/2024 Delivering provider: HENRY SLOUGH Date of discharge: 12/11/2024  Admitting diagnosis: H/O cesarean section [Z98.891] History of cesarean section [Z98.891] Intrauterine pregnancy: [redacted]w[redacted]d     Secondary diagnosis:  Active Problems:   History of cesarean section   Postpartum care following cesarean delivery  Additional problems: none    Discharge diagnosis: Term Pregnancy Delivered                                              Post partum procedures:none Augmentation: N/A Complications: None  Hospital course: Onset of Labor With Unplanned C/S   18 y.o. yo [id number] at [redacted]w[redacted]d was admitted in Latent Labor on 12/08/2024. [name] was scheduled for a repeat cesarean section on 12/09/24. She was observed overnight and made cervical change to 5 cm. [name] desired a repeast section. The patient went for cesarean section due to Elective Repeat. Delivery details as follows: Membrane Rupture Time/Date: 2:30 PM,12/09/2024  Delivery Method:C-Section, Low Transverse Operative Delivery:N/A Details of operation can be found in separate operative note. Patient had an uncomplicated postpartum course.  She is ambulating,tolerating a regular diet, passing flatus, and urinating well. Patient is discharged home in stable condition 12/11/2024.  Newborn Data: Birth date:12/09/2024 Birth time:2:31 PM Gender:Female Living status:Living Apgars:8 ,9  Weight:3470 g  Magnesium Sulfate received: No BMZ received: No Rhophylac:N/A MMR:N/A Transfusion:No Immunizations administered: Immunization History  Administered Date(s) Administered   DTaP 06/07/2004, 08/06/2004, 10/08/2004, 07/17/2005, 05/29/2008   HIB (PRP-OMP) 06/07/2004, 08/06/2004, 10/08/2004, 04/29/2005   HPV 9-valent 08/15/2016, 02/05/2017    Hepatitis B February 11, 2004, 06/07/2004, 08/06/2004, 10/08/2004   Hpv-Unspecified 08/15/2016, 02/05/2017   IPV 06/07/2004, 08/06/2004, 10/08/2004, 05/29/2008   Influenza,Quad,Nasal, Live 10/10/2013   Influenza,inj,Quad PF,6+ Mos 11/07/2015   MMR 04/29/2005, 05/29/2008   Meningococcal Conjugate 08/15/2016   Pneumococcal Conjugate-13 06/07/2004, 08/06/2004, 10/08/2004, 04/29/2005   Tdap 08/15/2016   Varicella 04/29/2005, 05/29/2008    Physical exam  Vitals:   12/10/24 0516 12/10/24 1045 12/10/24 1438 12/10/24 1942  BP: (!) 88/55 (!) 95/53 101/61 (!) 98/55  Pulse: 60 69 69 72  Resp: 16 18 18 18   Temp: 98.4 F (36.9 C) 97.6 F (36.4 C) 97.8 F (36.6 C) 98.5 F (36.9 C)  TempSrc: Oral Axillary Axillary Oral  SpO2:  97% 98% 99%  Weight:      Height:       General: alert, cooperative, and no distress Lochia: appropriate Uterine Fundus: firm Incision: Dressing is clean, dry, and intact DVT Evaluation: No evidence of DVT seen on physical exam. No cords or calf tenderness. No significant calf/ankle edema. Labs: Lab Results  Component Value Date   WBC 15.8 (H) 12/10/2024   HGB 9.3 (L) 12/10/2024   HCT 29.0 (L) 12/10/2024   MCV 77.1 (L) 12/10/2024   PLT 202 12/10/2024      Latest Ref Rng & Units 11/02/2017   11:20 AM  CMP  Glucose 65 - 99 mg/dL 88   BUN 6 - 20 mg/dL 5   Creatinine 9.49 - 8.99 mg/dL 9.32   Sodium 864 - 854 mmol/L 136   Potassium 3.5 - 5.1 mmol/L 3.8   Chloride 101 - 111 mmol/L 104   CO2 22 - 32 mmol/L 24  Calcium  8.9 - 10.3 mg/dL 9.4   Total Protein 6.5 - 8.1 g/dL 7.1   Total Bilirubin 0.3 - 1.2 mg/dL 0.8   Alkaline Phos 50 - 162 U/L 125   AST 15 - 41 U/L 17   ALT 14 - 54 U/L 10    Edinburgh Score:    12/10/2024    6:29 PM  Edinburgh Postnatal Depression Scale Screening Tool  I have been able to laugh and see the funny side of things. 0  I have looked forward with enjoyment to things. 0  I have blamed myself unnecessarily when things went wrong. 0   I have been anxious or worried for no good reason. 0  I have felt scared or panicky for no good reason. 0  Things have been getting on top of me. 0  I have been so unhappy that I have had difficulty sleeping. 0  I have felt sad or miserable. 0  I have been so unhappy that I have been crying. 0  The thought of harming myself has occurred to me. 0  Edinburgh Postnatal Depression Scale Total 0      After visit meds:  Allergies as of 12/11/2024   No Known Allergies      Medication List     TAKE these medications    acetaminophen  500 MG tablet Commonly known as: TYLENOL  Take 2 tablets (1,000 mg total) by mouth every 6 (six) hours. What changed: when to take this   FeroSul 325 (65 Fe) MG tablet Generic drug: ferrous sulfate Take 325 mg by mouth daily.   ibuprofen  600 MG tablet Commonly known as: ADVIL  Take 1 tablet (600 mg total) by mouth every 6 (six) hours.   oxyCODONE  5 MG immediate release tablet Commonly known as: Oxy IR/ROXICODONE  Take 1 tablet (5 mg total) by mouth every 6 (six) hours as needed for up to 5 days for moderate pain (pain score 4-6).   prenatal multivitamin Tabs tablet Take 1 tablet by mouth daily at 12 noon.               Discharge Care Instructions  (From admission, onward)           Start     Ordered   12/11/24 0000  Discharge wound care:       Comments: Take dressing off on 12/15/24, remove it sooner if it is dirty or damaged. Clean area with soap and water  and pat dry. You can leave the steri strips on until they fall off or take them off gently by 12/17/24. Call the office for increased drainage, redness, pain, or warmth. Keep the incision area clean and dry at all times.   12/11/24 0731             Discharge home in stable condition Infant Feeding: Breast Infant Disposition:home with mother Discharge instruction: per After Visit Summary and Postpartum booklet. Activity: Advance as tolerated. Pelvic rest for 6 weeks.   Diet: routine diet Anticipated Birth Control: natural family planning Postpartum Appointment:6 weeks Additional Postpartum F/U: none Future Appointments:No future appointments. Follow up Visit:  Follow-up Information     Henry Slough, MD. Schedule an appointment as soon as possible for a visit in 6 week(s).   Specialty: Obstetrics and Gynecology Contact information: [address] [place] [address] [phone]                     12/11/2024 Mercer KATHEE Peal, CNM

## 2024-12-11 NOTE — Lactation Note (Addendum)
 This note was copied from a baby's chart. Lactation Consultation Note  Patient Name: Heather Hall Unijb'd Date: 12/11/2024 Age:20 hours Reason for consult: Follow-up assessment;Infant weight loss  P2, 39 weeks. 8.5% weight loss.  3.6% in the last 24 hours.  4 voids/5 stools.  Previous latch scores of 9 & 10. Baby cluster fed last night.  Mother holding baby after recent feeding when LC entered the room. Mother has a pump at home. Discussed void/stool expectations and reviewed engorgement care. Mother is using personal nipple cream for tender nipples. Mother does not have questions at this time. Suggest calling for help as needed.   Maternal Data Has patient been taught Hand Expression?: Yes  Feeding Mother's Current Feeding Choice: Breast Milk  Interventions Interventions: Breast feeding basics reviewed;Education  Discharge Discharge Education: Engorgement and breast care;Warning signs for feeding baby Pump: DEBP;Personal  Consult Status Consult Status: Complete Date: 12/11/24  Heather Dines Boschen  RN, IBCLC 12/11/2024, 8:40 AM

## 2024-12-12 MED ORDER — BUPIVACAINE IN DEXTROSE 0.75-8.25 % IT SOLN
INTRATHECAL | Status: DC | PRN
  Administered 2024-12-09: 14:00:00 1.6 mL via INTRATHECAL

## 2024-12-12 NOTE — Anesthesia Postprocedure Evaluation (Signed)
 Anesthesia Post Note  Patient: Heather Hall  Procedure(s) Performed: CESAREAN DELIVERY     Patient location during evaluation: PACU Anesthesia Type: Spinal Level of consciousness: oriented and awake and alert Pain management: pain level controlled Vital Signs Assessment: post-procedure vital signs reviewed and stable Respiratory status: spontaneous breathing, respiratory function stable and patient connected to nasal cannula oxygen Cardiovascular status: blood pressure returned to baseline and stable Postop Assessment: no headache, no backache and no apparent nausea or vomiting Anesthetic complications: no   No notable events documented.  Last Vitals:  Vitals:   12/10/24 1942 12/11/24 0508  BP: (!) 98/55 96/63  Pulse: 72 60  Resp: 18 17  Temp: 36.9 C 36.8 C  SpO2: 99%     Last Pain:  Vitals:   12/11/24 1408  TempSrc:   PainSc: 10-Worst pain ever                 Jackie Littlejohn L Brode Sculley

## 2024-12-12 NOTE — Anesthesia Procedure Notes (Addendum)
 Spinal  Patient location during procedure: OR Start time: 12/09/2024 2:00 PM End time: 12/09/2024 2:03 PM Reason for block: surgical anesthesia  Staffing Performed: anesthesiologist  Authorized by: Niels Marien CROME, MD   Performed by: Niels Marien CROME, MD  Preanesthetic Checklist Completed: patient identified, IV checked, risks and benefits discussed, surgical consent, monitors and equipment checked, pre-op evaluation and timeout performed Spinal Block Patient position: sitting Prep: DuraPrep and site prepped and draped Patient monitoring: cardiac monitor, continuous pulse ox and blood pressure Approach: midline Location: L3-4 Injection technique: single-shot Needle Needle type: Pencan  Needle gauge: 24 G Needle length: 9 cm Assessment Sensory level: T6 Events: CSF return  Additional Notes Functioning IV was confirmed and monitors were applied. Sterile prep and drape, including hand hygiene and sterile gloves were used. The patient was positioned and the spine was prepped. The skin was anesthetized with lidocaine .  Free flow of clear CSF was obtained prior to injecting local anesthetic into the CSF.  The spinal needle aspirated freely following injection.  The needle was carefully withdrawn.  The patient tolerated the procedure well.

## 2024-12-19 ENCOUNTER — Telehealth (HOSPITAL_COMMUNITY): Payer: Self-pay

## 2024-12-19 NOTE — Telephone Encounter (Signed)
 12/19/2024 0941  Name: Heather Hall Arrambide MRN: [id number] DOB: 2006/05/09  Reason for Call:  Transition of Care Hospital Discharge Call  Contact Status: Patient Contact Status: Complete  Language assistant needed:          Follow-Up Questions: Do You Have Any Concerns About Your Health As You Heal From Delivery?: No Do You Have Any Concerns About Your Infants Health?: No  Edinburgh Postnatal Depression Scale:  In the Past 7 Days: I have been able to laugh and see the funny side of things.: As much as I always could I have looked forward with enjoyment to things.: As much as I ever did I have blamed myself unnecessarily when things went wrong.: No, never I have been anxious or worried for no good reason.: No, not at all I have felt scared or panicky for no good reason.: No, not at all Things have been getting on top of me.: No, I have been coping as well as ever I have been so unhappy that I have had difficulty sleeping.: Not at all I have felt sad or miserable.: No, not at all I have been so unhappy that I have been crying.: No, never The thought of harming myself has occurred to me.: Never [place] Postnatal Depression Scale Total: 0  PHQ2-9 Depression Scale:     Discharge Follow-up: Edinburgh score requires follow up?: No Patient was advised of the following resources:: Breastfeeding Support Group, Support Group  Post-discharge interventions: Reviewed Newborn Safe Sleep Practices  Signature  Rosaline Deretha PEAK
